# Patient Record
Sex: Male | Born: 1950 | Race: White | Hispanic: No | Marital: Married | Smoking: Never smoker
Health system: Southern US, Community
[De-identification: ages and names within clinical notes are randomized; demographics above are authoritative.]

## PROBLEM LIST (undated history)

## (undated) DIAGNOSIS — M1990 Unspecified osteoarthritis, unspecified site: Secondary | ICD-10-CM

## (undated) HISTORY — PX: NOSE SURGERY: SHX723

---

## 2009-06-16 HISTORY — PX: HERNIA REPAIR: SHX51

## 2012-04-15 ENCOUNTER — Other Ambulatory Visit (HOSPITAL_COMMUNITY): Payer: Self-pay | Admitting: Orthopaedic Surgery

## 2012-06-14 ENCOUNTER — Encounter (HOSPITAL_COMMUNITY): Payer: Self-pay | Admitting: Pharmacy Technician

## 2012-06-17 ENCOUNTER — Encounter (HOSPITAL_COMMUNITY)
Admission: RE | Admit: 2012-06-17 | Discharge: 2012-06-17 | Disposition: A | Discharge status: Home / Self Care | Payer: BC Managed Care – PPO | Source: Ambulatory Visit | Attending: Orthopaedic Surgery | Admitting: Orthopaedic Surgery

## 2012-06-17 ENCOUNTER — Encounter (HOSPITAL_COMMUNITY): Payer: Self-pay

## 2012-06-17 HISTORY — DX: Unspecified osteoarthritis, unspecified site: M19.90

## 2012-06-17 LAB — URINALYSIS, ROUTINE W REFLEX MICROSCOPIC
Bilirubin Urine: NEGATIVE
Glucose, UA: NEGATIVE mg/dL
Hgb urine dipstick: NEGATIVE
Ketones, ur: NEGATIVE mg/dL
Leukocytes, UA: NEGATIVE
Protein, ur: NEGATIVE mg/dL
pH: 6 (ref 5.0–8.0)

## 2012-06-17 LAB — BASIC METABOLIC PANEL
BUN: 19 mg/dL (ref 6–23)
CO2: 30 mEq/L (ref 19–32)
GFR calc non Af Amer: 88 mL/min — ABNORMAL LOW (ref >90)
Glucose, Bld: 87 mg/dL (ref 70–99)
Potassium: 4.4 mEq/L (ref 3.5–5.1)

## 2012-06-17 LAB — CBC
Hemoglobin: 14.4 g/dL (ref 13.0–17.0)
MCH: 28 pg (ref 26.0–34.0)
MCHC: 34.6 g/dL (ref 30.0–36.0)
MCV: 80.8 fL (ref 78.0–100.0)
RBC: 5.15 MIL/uL (ref 4.22–5.81)

## 2012-06-17 NOTE — Patient Instructions (Signed)
[name]  06/17/2012   Your procedure is scheduled on:  Friday 06/25/2012   Report to [facility] at 0530 AM.  Call this number if you have problems the morning of surgery: [phone]   Remember:   Do not eat food:After Midnight.  May have clear liquids:until Midnight .  Clear liquids include soda, tea, black coffee, apple or grape juice, broth.  Take these medicines the morning of surgery with A SIP OF WATER: none   Do not wear jewelry, make-up or nail polish.  Do not wear lotions, powders, or perfumes. You may wear deodorant.  Do not shave 48 hours prior to surgery. Men may shave face and neck.  Do not bring valuables to the hospital.  Contacts, dentures or bridgework may not be worn into surgery.  Leave suitcase in the car. After surgery it may be brought to your room.  For patients admitted to the hospital, checkout time is 11:00 AM the day of discharge.     Name and phone number of your drive: [email or web address]  Special Instructions: Read [facility] PREPARING FOR SURGERY-THIS IS ABOUT YOUR [name] BEFORE SURGERY!   Please read over the following fact sheets that you were given: MRSA Information, Blood Transfusion Sheet, Incentive Spirometry Sheet                If you do not follow these instructions as instructed, your surgery may be cancelled!                Patient signature:

## 2012-06-25 ENCOUNTER — Encounter (HOSPITAL_COMMUNITY)
Admission: RE | Disposition: A | Discharge status: Home / Self Care | Payer: Self-pay | Source: Ambulatory Visit | Attending: Orthopaedic Surgery

## 2012-06-25 ENCOUNTER — Encounter (HOSPITAL_COMMUNITY): Payer: Self-pay | Admitting: Anesthesiology

## 2012-06-25 ENCOUNTER — Inpatient Hospital Stay (HOSPITAL_COMMUNITY)
Admission: RE | Admit: 2012-06-25 | Discharge: 2012-06-26 | DRG: 818 | Disposition: A | Discharge status: Home / Self Care | Payer: BC Managed Care – PPO | Source: Ambulatory Visit | Attending: Orthopaedic Surgery | Admitting: Orthopaedic Surgery

## 2012-06-25 ENCOUNTER — Ambulatory Visit (HOSPITAL_COMMUNITY): Payer: BC Managed Care – PPO

## 2012-06-25 ENCOUNTER — Encounter (HOSPITAL_COMMUNITY): Payer: Self-pay | Admitting: *Deleted

## 2012-06-25 ENCOUNTER — Ambulatory Visit (HOSPITAL_COMMUNITY): Payer: BC Managed Care – PPO | Admitting: Anesthesiology

## 2012-06-25 DIAGNOSIS — M1610 Unilateral primary osteoarthritis, unspecified hip: Principal | ICD-10-CM | POA: Diagnosis present

## 2012-06-25 DIAGNOSIS — M169 Osteoarthritis of hip, unspecified: Secondary | ICD-10-CM

## 2012-06-25 HISTORY — PX: TOTAL HIP ARTHROPLASTY: SHX124

## 2012-06-25 LAB — ABO/RH: ABO/RH(D): A POS

## 2012-06-25 LAB — TYPE AND SCREEN

## 2012-06-25 SURGERY — ARTHROPLASTY, HIP, TOTAL, ANTERIOR APPROACH
Anesthesia: General | Site: Hip | Laterality: Right | Wound class: Clean

## 2012-06-25 MED ORDER — CEFAZOLIN SODIUM-DEXTROSE 2-3 GM-% IV SOLR
2.0000 g | INTRAVENOUS | Status: AC
  Administered 2012-06-25: 2 g via INTRAVENOUS

## 2012-06-25 MED ORDER — LACTATED RINGERS IV SOLN
INTRAVENOUS | Status: DC | PRN
  Administered 2012-06-25 (×4): via INTRAVENOUS

## 2012-06-25 MED ORDER — HYDROMORPHONE HCL PF 1 MG/ML IJ SOLN
0.2500 mg | INTRAMUSCULAR | Status: DC | PRN
  Administered 2012-06-25 (×4): 0.5 mg via INTRAVENOUS

## 2012-06-25 MED ORDER — ONDANSETRON HCL 4 MG/2ML IJ SOLN
INTRAMUSCULAR | Status: DC | PRN
  Administered 2012-06-25: 4 mg via INTRAVENOUS

## 2012-06-25 MED ORDER — HYDROMORPHONE HCL PF 1 MG/ML IJ SOLN
1.0000 mg | INTRAMUSCULAR | Status: DC | PRN
  Administered 2012-06-25: 1 mg via INTRAVENOUS

## 2012-06-25 MED ORDER — ALUM & MAG HYDROXIDE-SIMETH 200-200-20 MG/5ML PO SUSP: 30.0000 mL | ORAL | Status: DC | PRN

## 2012-06-25 MED ORDER — PROMETHAZINE HCL 25 MG/ML IJ SOLN: 6.2500 mg | INTRAMUSCULAR | Status: DC | PRN

## 2012-06-25 MED ORDER — LACTATED RINGERS IV SOLN
INTRAVENOUS | Status: DC
  Administered 2012-06-25: 10:00:00 via INTRAVENOUS

## 2012-06-25 MED ORDER — OXYCODONE HCL ER 20 MG PO T12A
20.0000 mg | EXTENDED_RELEASE_TABLET | Freq: Two times a day (BID) | ORAL | Status: DC
  Administered 2012-06-25: 20 mg via ORAL
  Filled 2012-06-25: qty 1

## 2012-06-25 MED ORDER — DIPHENHYDRAMINE HCL 12.5 MG/5ML PO ELIX
12.5000 mg | ORAL_SOLUTION | ORAL | Status: DC | PRN
  Administered 2012-06-25: 12.5 mg via ORAL
  Filled 2012-06-25: qty 5

## 2012-06-25 MED ORDER — HYDROMORPHONE HCL PF 1 MG/ML IJ SOLN
INTRAMUSCULAR | Status: AC
  Filled 2012-06-25: qty 1

## 2012-06-25 MED ORDER — EPHEDRINE SULFATE 50 MG/ML IJ SOLN
INTRAMUSCULAR | Status: DC | PRN
  Administered 2012-06-25: 5 mg via INTRAVENOUS

## 2012-06-25 MED ORDER — ZOLPIDEM TARTRATE 5 MG PO TABS: 5.0000 mg | ORAL_TABLET | Freq: Every evening | ORAL | Status: DC | PRN

## 2012-06-25 MED ORDER — ASPIRIN EC 325 MG PO TBEC
325.0000 mg | DELAYED_RELEASE_TABLET | Freq: Two times a day (BID) | ORAL | Status: DC
  Administered 2012-06-26: 325 mg via ORAL
  Filled 2012-06-25 (×3): qty 1

## 2012-06-25 MED ORDER — OXYCODONE-ACETAMINOPHEN 5-325 MG PO TABS: 1.0000 | ORAL_TABLET | ORAL | Status: AC | PRN

## 2012-06-25 MED ORDER — DOCUSATE SODIUM 100 MG PO CAPS
100.0000 mg | ORAL_CAPSULE | Freq: Two times a day (BID) | ORAL | Status: DC
  Administered 2012-06-25 – 2012-06-26 (×2): 100 mg via ORAL

## 2012-06-25 MED ORDER — METHOCARBAMOL 100 MG/ML IJ SOLN
500.0000 mg | Freq: Four times a day (QID) | INTRAVENOUS | Status: DC | PRN
  Administered 2012-06-25: 500 mg via INTRAVENOUS
  Filled 2012-06-25: qty 5

## 2012-06-25 MED ORDER — FERROUS SULFATE 325 (65 FE) MG PO TABS
325.0000 mg | ORAL_TABLET | Freq: Three times a day (TID) | ORAL | Status: DC
  Administered 2012-06-25 – 2012-06-26 (×2): 325 mg via ORAL
  Filled 2012-06-25 (×5): qty 1

## 2012-06-25 MED ORDER — OXYCODONE HCL 5 MG PO TABS: 5.0000 mg | ORAL_TABLET | ORAL | Status: DC | PRN

## 2012-06-25 MED ORDER — KETOROLAC TROMETHAMINE 15 MG/ML IJ SOLN
15.0000 mg | Freq: Four times a day (QID) | INTRAMUSCULAR | Status: AC
  Administered 2012-06-25 – 2012-06-26 (×4): 15 mg via INTRAVENOUS
  Filled 2012-06-25 (×4): qty 1

## 2012-06-25 MED ORDER — CEFAZOLIN SODIUM 1-5 GM-% IV SOLN
1.0000 g | Freq: Four times a day (QID) | INTRAVENOUS | Status: AC
  Administered 2012-06-25 (×2): 1 g via INTRAVENOUS
  Filled 2012-06-25 (×2): qty 50

## 2012-06-25 MED ORDER — SODIUM CHLORIDE 0.9 % IV SOLN
INTRAVENOUS | Status: DC
  Administered 2012-06-25: 13:00:00 via INTRAVENOUS

## 2012-06-25 MED ORDER — METHOCARBAMOL 500 MG PO TABS: 500.0000 mg | ORAL_TABLET | Freq: Four times a day (QID) | ORAL | Status: DC | PRN

## 2012-06-25 MED ORDER — 0.9 % SODIUM CHLORIDE (POUR BTL) OPTIME
TOPICAL | Status: DC | PRN
  Administered 2012-06-25: 1000 mL

## 2012-06-25 MED ORDER — ONDANSETRON HCL 4 MG/2ML IJ SOLN: 4.0000 mg | Freq: Four times a day (QID) | INTRAMUSCULAR | Status: DC | PRN

## 2012-06-25 MED ORDER — LACTATED RINGERS IV SOLN: INTRAVENOUS | Status: DC

## 2012-06-25 MED ORDER — ACETAMINOPHEN 10 MG/ML IV SOLN
INTRAVENOUS | Status: DC | PRN
  Administered 2012-06-25: 1000 mg via INTRAVENOUS

## 2012-06-25 MED ORDER — ASPIRIN 325 MG PO TBEC: 325.0000 mg | DELAYED_RELEASE_TABLET | Freq: Two times a day (BID) | ORAL | Status: DC

## 2012-06-25 MED ORDER — METHOCARBAMOL 500 MG PO TABS
500.0000 mg | ORAL_TABLET | Freq: Four times a day (QID) | ORAL | Status: DC | PRN
  Administered 2012-06-25: 500 mg via ORAL
  Filled 2012-06-25: qty 1

## 2012-06-25 MED ORDER — PROPOFOL 10 MG/ML IV BOLUS
INTRAVENOUS | Status: DC | PRN
  Administered 2012-06-25: 170 mg via INTRAVENOUS

## 2012-06-25 MED ORDER — ONDANSETRON HCL 4 MG PO TABS: 4.0000 mg | ORAL_TABLET | Freq: Four times a day (QID) | ORAL | Status: DC | PRN

## 2012-06-25 MED ORDER — FERROUS SULFATE 325 (65 FE) MG PO TABS: 325.0000 mg | ORAL_TABLET | Freq: Three times a day (TID) | ORAL | Status: DC

## 2012-06-25 MED ORDER — ADULT MULTIVITAMIN W/MINERALS CH
1.0000 | ORAL_TABLET | Freq: Every day | ORAL | Status: DC
  Administered 2012-06-25 – 2012-06-26 (×2): 1 via ORAL
  Filled 2012-06-25 (×2): qty 1

## 2012-06-25 MED ORDER — CISATRACURIUM BESYLATE (PF) 10 MG/5ML IV SOLN
INTRAVENOUS | Status: DC | PRN
  Administered 2012-06-25: 10 mg via INTRAVENOUS
  Administered 2012-06-25: 3 mg via INTRAVENOUS

## 2012-06-25 MED ORDER — ACETAMINOPHEN 325 MG PO TABS
650.0000 mg | ORAL_TABLET | Freq: Four times a day (QID) | ORAL | Status: DC | PRN
  Administered 2012-06-25: 650 mg via ORAL
  Filled 2012-06-25: qty 2

## 2012-06-25 MED ORDER — MIDAZOLAM HCL 5 MG/5ML IJ SOLN
INTRAMUSCULAR | Status: DC | PRN
  Administered 2012-06-25: 2 mg via INTRAVENOUS

## 2012-06-25 MED ORDER — SODIUM CHLORIDE 0.9 % IR SOLN
Status: DC | PRN
  Administered 2012-06-25: 1000 mL

## 2012-06-25 MED ORDER — MENTHOL 3 MG MT LOZG: 1.0000 | LOZENGE | OROMUCOSAL | Status: DC | PRN

## 2012-06-25 MED ORDER — CEFAZOLIN SODIUM-DEXTROSE 2-3 GM-% IV SOLR
INTRAVENOUS | Status: AC
  Filled 2012-06-25: qty 50

## 2012-06-25 MED ORDER — METOCLOPRAMIDE HCL 10 MG PO TABS: 5.0000 mg | ORAL_TABLET | Freq: Three times a day (TID) | ORAL | Status: DC | PRN

## 2012-06-25 MED ORDER — ACETAMINOPHEN 650 MG RE SUPP: 650.0000 mg | Freq: Four times a day (QID) | RECTAL | Status: DC | PRN

## 2012-06-25 MED ORDER — FENTANYL CITRATE 0.05 MG/ML IJ SOLN
INTRAMUSCULAR | Status: DC | PRN
  Administered 2012-06-25: 50 ug via INTRAVENOUS
  Administered 2012-06-25 (×2): 100 ug via INTRAVENOUS

## 2012-06-25 MED ORDER — METOCLOPRAMIDE HCL 5 MG/ML IJ SOLN: 5.0000 mg | Freq: Three times a day (TID) | INTRAMUSCULAR | Status: DC | PRN

## 2012-06-25 MED ORDER — PHENOL 1.4 % MT LIQD: 1.0000 | OROMUCOSAL | Status: DC | PRN

## 2012-06-25 MED ORDER — ACETAMINOPHEN 10 MG/ML IV SOLN
INTRAVENOUS | Status: AC
  Filled 2012-06-25: qty 100

## 2012-06-25 SURGICAL SUPPLY — 38 items
BAG ZIPLOCK 12X15 (MISCELLANEOUS) ×4 IMPLANT
BLADE SAW SGTL 18X1.27X75 (BLADE) ×2 IMPLANT
CLOTH BEACON ORANGE TIMEOUT ST (SAFETY) ×2 IMPLANT
DERMABOND ADVANCED (GAUZE/BANDAGES/DRESSINGS) ×2
DERMABOND ADVANCED .7 DNX12 (GAUZE/BANDAGES/DRESSINGS) ×2 IMPLANT
DRAPE C-ARM 42X72 X-RAY (DRAPES) ×2 IMPLANT
DRAPE STERI IOBAN 125X83 (DRAPES) ×2 IMPLANT
DRAPE U-SHAPE 47X51 STRL (DRAPES) ×6 IMPLANT
DRSG AQUACEL AG ADV 3.5X10 (GAUZE/BANDAGES/DRESSINGS) ×2 IMPLANT
DRSG MEPILEX BORDER 4X8 (GAUZE/BANDAGES/DRESSINGS) ×2 IMPLANT
DURAPREP 26ML APPLICATOR (WOUND CARE) ×2 IMPLANT
ELECT BLADE TIP CTD 4 INCH (ELECTRODE) ×2 IMPLANT
ELECT REM PT RETURN 9FT ADLT (ELECTROSURGICAL) ×2
ELECTRODE REM PT RTRN 9FT ADLT (ELECTROSURGICAL) ×1 IMPLANT
FACESHIELD LNG OPTICON STERILE (SAFETY) ×8 IMPLANT
GAUZE XEROFORM 1X8 LF (GAUZE/BANDAGES/DRESSINGS) ×2 IMPLANT
GLOVE BIO SURGEON STRL SZ7 (GLOVE) ×2 IMPLANT
GLOVE BIO SURGEON STRL SZ7.5 (GLOVE) ×2 IMPLANT
GLOVE BIOGEL PI IND STRL 7.5 (GLOVE) IMPLANT
GLOVE BIOGEL PI IND STRL 8 (GLOVE) ×1 IMPLANT
GLOVE BIOGEL PI INDICATOR 7.5 (GLOVE)
GLOVE BIOGEL PI INDICATOR 8 (GLOVE) ×1
GLOVE ECLIPSE 7.0 STRL STRAW (GLOVE) ×2 IMPLANT
GOWN STRL REIN XL XLG (GOWN DISPOSABLE) ×4 IMPLANT
HANDPIECE INTERPULSE COAX TIP (DISPOSABLE) ×1
KIT BASIN OR (CUSTOM PROCEDURE TRAY) ×2 IMPLANT
PACK TOTAL JOINT (CUSTOM PROCEDURE TRAY) ×2 IMPLANT
PADDING CAST COTTON 6X4 STRL (CAST SUPPLIES) ×2 IMPLANT
SET HNDPC FAN SPRY TIP SCT (DISPOSABLE) ×1 IMPLANT
STAPLER VISISTAT 35W (STAPLE) IMPLANT
SUT ETHIBOND NAB CT1 #1 30IN (SUTURE) ×4 IMPLANT
SUT VIC AB 1 CT1 36 (SUTURE) ×4 IMPLANT
SUT VIC AB 2-0 CT1 27 (SUTURE) ×2
SUT VIC AB 2-0 CT1 TAPERPNT 27 (SUTURE) ×2 IMPLANT
TOWEL OR 17X26 10 PK STRL BLUE (TOWEL DISPOSABLE) ×4 IMPLANT
TOWEL OR NON WOVEN STRL DISP B (DISPOSABLE) ×2 IMPLANT
TRAY FOLEY CATH 14FRSI W/METER (CATHETERS) ×2 IMPLANT
WATER STERILE IRR 1500ML POUR (IV SOLUTION) ×2 IMPLANT

## 2012-06-25 NOTE — Anesthesia Preprocedure Evaluation (Addendum)
Anesthesia Evaluation  Patient identified by MRN, date of birth, ID band Patient awake    Reviewed: Allergy & Precautions, H&P , NPO status , Patient's Chart, lab work & pertinent test results  Airway Mallampati: I TM Distance: >3 FB Neck ROM: Full    Dental  (+) Teeth Intact and Dental Advisory Given   Pulmonary neg pulmonary ROS,  breath sounds clear to auscultation  Pulmonary exam normal       Cardiovascular negative cardio ROS  Rhythm:Regular Rate:Normal     Neuro/Psych negative neurological ROS  negative psych ROS   GI/Hepatic negative GI ROS, Neg liver ROS,   Endo/Other  negative endocrine ROS  Renal/GU negative Renal ROS  negative genitourinary   Musculoskeletal negative musculoskeletal ROS (+)   Abdominal   Peds  Hematology negative hematology ROS (+)   Anesthesia Other Findings   Reproductive/Obstetrics negative OB ROS                           Anesthesia Physical Anesthesia Plan  ASA: I  Anesthesia Plan: General   Post-op Pain Management:    Induction: Intravenous  Airway Management Planned: Oral ETT  Additional Equipment:   Intra-op Plan:   Post-operative Plan: Extubation in OR  Informed Consent: I have reviewed the patients History and Physical, chart, labs and discussed the procedure including the risks, benefits and alternatives for the proposed anesthesia with the patient or authorized representative who has indicated his/her understanding and acceptance.   Dental advisory given  Plan Discussed with: CRNA  Anesthesia Plan Comments:         Anesthesia Quick Evaluation

## 2012-06-25 NOTE — Transfer of Care (Signed)
Immediate Anesthesia Transfer of Care Note  Patient: Earl Stanley  Procedure(s) Performed: Procedure(s) (LRB) with comments: TOTAL HIP ARTHROPLASTY ANTERIOR APPROACH (Right) - Right Total Hip Arthroplasty, Anterior Approach (C-Arm)  Patient Location: PACU  Anesthesia Type:General  Level of Consciousness: awake, sedated and patient cooperative  Airway & Oxygen Therapy: Patient Spontanous Breathing and Patient connected to face mask oxygen  Post-op Assessment: Report given to PACU RN and Post -op Vital signs reviewed and stable  Post vital signs: Reviewed and stable  Complications: No apparent anesthesia complications

## 2012-06-25 NOTE — Anesthesia Postprocedure Evaluation (Signed)
Anesthesia Post Note  Patient: Earl Stanley  Procedure(s) Performed: Procedure(s) (LRB): TOTAL HIP ARTHROPLASTY ANTERIOR APPROACH (Right)  Anesthesia type: General  Patient location: PACU  Post pain: Pain level controlled  Post assessment: Post-op Vital signs reviewed  Last Vitals:  Filed Vitals:   06/25/12 1431  BP: 116/59  Pulse: 71  Temp: 37 C  Resp: 16    Post vital signs: Reviewed  Level of consciousness: sedated  Complications: No apparent anesthesia complications

## 2012-06-25 NOTE — Plan of Care (Signed)
Problem: Consults Goal: Diagnosis- Total Joint Replacement Right anterior hip     

## 2012-06-25 NOTE — H&P (Signed)
TOTAL HIP ADMISSION H&P  Patient is admitted for right total hip arthroplasty.  Subjective:  Chief Complaint: right hip pain  HPI: Earl Stanley, 62 y.o. male, has a history of pain and functional disability in the right hip(s) due to arthritis and patient has failed non-surgical conservative treatments for greater than 12 weeks to include NSAID's and/or analgesics, flexibility and strengthening excercises, weight reduction as appropriate and activity modification.  Onset of symptoms was gradual starting 6 years ago with gradually worsening course since that time.The patient noted no past surgery on the right hip(s).  Patient currently rates pain in the right hip at 10 out of 10 with activity. Patient has night pain, worsening of pain with activity and weight bearing, pain that interfers with activities of daily living, pain with passive range of motion and crepitus. Patient has evidence of subchondral cysts, subchondral sclerosis, periarticular osteophytes and joint space narrowing by imaging studies. This condition presents safety issues increasing the risk of falls.  There is no current active infection.  Patient Active Problem List   Diagnosis Date Noted  . Degenerative arthritis of hip 06/25/2012   Past Medical History  Diagnosis Date  . Arthritis     Past Surgical History  Procedure Date  . Hernia repair 2011    left inguinal hernia repair  . Nose surgery     chain saw accident on nose    Prescriptions prior to admission  Medication Sig Dispense Refill  . Multiple Vitamin (MULTIVITAMIN WITH MINERALS) TABS Take 1 tablet by mouth daily.       No Known Allergies  History  Substance Use Topics  . Smoking status: Not on file  . Smokeless tobacco: Not on file  . Alcohol Use: No    History reviewed. No pertinent family history.   Review of Systems  Musculoskeletal: Positive for joint pain.  All other systems reviewed and are negative.    Objective:  Physical Exam    Constitutional: He is oriented to person, place, and time. He appears well-developed and well-nourished.  HENT:  Head: Normocephalic and atraumatic.  Eyes: EOM are normal. Pupils are equal, round, and reactive to light.  Neck: Normal range of motion. Neck supple.  Cardiovascular: Normal rate and regular rhythm.   Respiratory: Effort normal and breath sounds normal.  GI: Soft. Bowel sounds are normal.  Musculoskeletal:       Right hip: He exhibits decreased range of motion, decreased strength, bony tenderness and crepitus.  Neurological: He is alert and oriented to person, place, and time.  Skin: Skin is warm and dry.  Psychiatric: He has a normal mood and affect.    Vital signs in last 24 hours: Temp:  [97.5 F (36.4 C)] 97.5 F (36.4 C) (01/10 0521) Pulse Rate:  [58] 58  (01/10 0521) Resp:  [18] 18  (01/10 0521) BP: (131)/(61) 131/61 mmHg (01/10 0521) SpO2:  [100 %] 100 % (01/10 0521)  Labs:   There is no height or weight on file to calculate BMI.   Imaging Review Plain radiographs demonstrate severe degenerative joint disease of the bilateral hip(s). The bone quality appears to be good for age and reported activity level.  Assessment/Plan:  End stage arthritis, right hip(s)  The patient history, physical examination, clinical judgement of the provider and imaging studies are consistent with end stage degenerative joint disease of the right hip(s) and total hip arthroplasty is deemed medically necessary. The treatment options including medical management, injection therapy, arthroscopy and arthroplasty were discussed  at length. The risks and benefits of total hip arthroplasty were presented and reviewed. The risks due to aseptic loosening, infection, stiffness, dislocation/subluxation,  thromboembolic complications and other imponderables were discussed.  The patient acknowledged the explanation, agreed to proceed with the plan and consent was signed. Patient is being admitted  for inpatient treatment for surgery, pain control, PT, OT, prophylactic antibiotics, VTE prophylaxis, progressive ambulation and ADL's and discharge planning.The patient is planning to be discharged home with home health services

## 2012-06-25 NOTE — Evaluation (Signed)
Physical Therapy Evaluation Patient Details Name: Earl Stanley MRN: [id number] DOB: 05-13-1951 Today's Date: 06/25/2012 Time: 4098-1191 PT Time Calculation (min): 35 min  PT Assessment / Plan / Recommendation Clinical Impression  Pt. is s/p RTHA-direct anterior today 1/10. Pt ambulated 400 ft w/ RW. Pt. may be able to use crutches vs. RW. Pt will benefit from PT while in acute. Possible DC tomorrow.    PT Assessment  Patient needs continued PT services    Follow Up Recommendations  Home health PT    Does the patient have the potential to tolerate intense rehabilitation      Barriers to Discharge        Equipment Recommendations  Rolling walker with 5" wheels (possible will need tall Rw.)    Recommendations for Other Services     Frequency 7X/week    Precautions / Restrictions Precautions Precautions: None   Pertinent Vitals/Pain Sore in ant. Hip on R      Mobility  Bed Mobility Bed Mobility: Supine to Sit Supine to Sit: 4: Min assist;With rails;HOB elevated Details for Bed Mobility Assistance: cues to turn at angle. Transfers Transfers: Sit to Stand;Stand to Sit Sit to Stand: 4: Min assist;From bed Stand to Sit: To chair/3-in-1;With armrests;5: Supervision Ambulation/Gait Ambulation/Gait Assistance: 4: Min guard Ambulation Distance (Feet): 400 Feet Assistive device: Rolling walker Ambulation/Gait Assistance Details: cues to not let go RW while walking, sequence. full WB allowed. Gait Pattern: Step-through pattern;Decreased stance time - right    Shoulder Instructions     Exercises     PT Diagnosis: Difficulty walking  PT Problem List: Decreased strength;Decreased range of motion;Decreased activity tolerance;Decreased mobility;Decreased safety awareness PT Treatment Interventions: DME instruction;Gait training;Stair training;Functional mobility training;Therapeutic activities;Therapeutic exercise;Patient/family education   PT Goals Acute Rehab PT  Goals PT Goal Formulation: With patient/family Time For Goal Achievement: 07/02/12 Potential to Achieve Goals: Good Pt will go Supine/Side to Sit: Independently PT Goal: Supine/Side to Sit - Progress: Goal set today Pt will go Sit to Supine/Side: Independently PT Goal: Sit to Supine/Side - Progress: Goal set today Pt will go Sit to Stand: with supervision PT Goal: Sit to Stand - Progress: Goal set today Pt will go Stand to Sit: with modified independence PT Goal: Stand to Sit - Progress: Goal set today Pt will Ambulate: >150 feet;with supervision;with least restrictive assistive device PT Goal: Ambulate - Progress: Goal set today Pt will Go Up / Down Stairs: 1-2 stairs;with supervision;with least restrictive assistive device PT Goal: Up/Down Stairs - Progress: Goal set today Pt will Perform Home Exercise Program: Independently PT Goal: Perform Home Exercise Program - Progress: Goal set today  Visit Information  Last PT Received On: 06/25/12 Assistance Needed: +1    Subjective Data  Subjective: It feels so good to get up. I can go to work [employer]. Patient Stated Goal: to go home soon   Prior Functioning  Home Living Lives With: Spouse Available Help at Discharge: Family Type of Home: House Home Access: Stairs to enter Home Layout: One level Bathroom Toilet: Handicapped height Home Adaptive Equipment: Environmental consultant - rolling;Crutches (can borrow a RW, ?if tall enough.) Prior Function Level of Independence: Independent Able to Take Stairs?: Yes Driving: Yes Vocation: Full time employment Communication Communication: No difficulties    Cognition  Overall Cognitive Status: Appears within functional limits for tasks assessed/performed Arousal/Alertness: Awake/alert Orientation Level: Appears intact for tasks assessed Behavior During Session: [facility] for tasks performed    Extremity/Trunk Assessment Right Lower Extremity Assessment RLE ROM/Strength/Tone: Deficits RLE  ROM/Strength/Tone Deficits: decreased hip flexion, able to advance RLE  during gait.  RLE Sensation: WFL - Light Touch Left Lower Extremity Assessment LLE ROM/Strength/Tone: WFL for tasks assessed Trunk Assessment Trunk Assessment: Normal   Balance    End of Session PT - End of Session Activity Tolerance: Patient tolerated treatment well Patient left: in chair;with call bell/phone within reach Nurse Communication: Mobility status  GP     Rada Hay 06/25/2012, 4:52 PM

## 2012-06-25 NOTE — Brief Op Note (Signed)
06/25/2012  9:51 AM  PATIENT:  Earl Stanley  62 y.o. male  PRE-OPERATIVE DIAGNOSIS:  Severe osteoarthritis right hip  POST-OPERATIVE DIAGNOSIS:  Severe osteoarthritis right hip  PROCEDURE:  Procedure(s) (LRB) with comments: TOTAL HIP ARTHROPLASTY ANTERIOR APPROACH (Right) - Right Total Hip Arthroplasty, Anterior Approach (C-Arm)  SURGEON:  Surgeon(s) and Role:    * Kathryne Hitch, MD - Primary  PHYSICIAN ASSISTANT:   ASSISTANTS: none   ANESTHESIA:   general  EBL:  Total I/O In: 3600 [I.V.:3600] Out: 1225 [Urine:475; Blood:750]  BLOOD ADMINISTERED:none  DRAINS: none   LOCAL MEDICATIONS USED:  NONE  SPECIMEN:  No Specimen  DISPOSITION OF SPECIMEN:  N/A  COUNTS:  YES  TOURNIQUET:  * No tourniquets in log *  DICTATION: .Other Dictation: Dictation Number [id number]  PLAN OF CARE: Admit to inpatient   PATIENT DISPOSITION:  PACU - hemodynamically stable.   Delay start of Pharmacological VTE agent (>24hrs) due to surgical blood loss or risk of bleeding: no

## 2012-06-25 NOTE — Op Note (Signed)
NAMEROSS, BENDER NO.:  [id number]  MEDICAL RECORD NO.:  [id number]  LOCATION:  1609                         FACILITY:  [facility]  PHYSICIAN:  Vanita Panda. Magnus Ivan, M.D.DATE OF BIRTH:  08/27/1950  DATE OF PROCEDURE:  06/25/2012 DATE OF DISCHARGE:                              OPERATIVE REPORT   PREOPERATIVE DIAGNOSES:  End-stage arthritis and degenerative joint disease, right hip.  POSTOPERATIVE DIAGNOSES:  End-stage arthritis and degenerative joint disease, right hip.  PROCEDURE:  Right total hip arthroplasty through direct anterior approach.  IMPLANTS:  DePuy Sector Gription acetabular component size 54, size 36+ 4 neutral polyethylene liner, size 13 Corail femoral component with standard offset (KA), size 36+ 1.5 ceramic hip ball.  SURGEON:  Vanita Panda. Magnus Ivan, M.D.  ANESTHESIA:  General.  BLOOD LOSS:  750 mL.  ANTIBIOTICS:  2 g IV Ancef.  COMPLICATIONS:  None.  INDICATIONS:  Mr. Lupercio is a 62 year old gentleman with well documented end-stage arthritis of his right hip.  His x-rays show complete loss of the joint space, subchondral sclerosis cyst and peripheral osteophytes. He has failed conservative treatment and wished to proceed with a total hip arthroplasty given the daily pain and the effect of activities of daily living.  He understands the risks of acute blood loss, anemia, DVT, PE, infection and nerve and vessel injury.  He does give informed consent for surgery.  PROCEDURE DESCRIPTION:  After informed consent was obtained, appropriate right hip was marked.  He was brought to the operating room and general anesthesia was obtained while he was on the stretcher.  Foley catheter was placed in both feet, had traction boots applied to them.  He was placed supine on the Hana OSI fracture table with the perineal post in place and both legs in inline skeletal traction but no traction applied. We assessed his right and left hips and the  center of the pelvis under direct fluoroscopy, so we could obtain our leg lengths.  We then prepped the right hip meticulously with DuraPrep and sterile drapes.  Time-out was called and he was identified the correct patient, correct right hip. I then made an incision inferior and posterior to the anterior superior iliac spine and dissected down to the tensor fascia lata.  The tensor fascia was then divided longitudinally and proceeded with a direct anterior approach to the hip.  A Cobra retractor was placed around the lateral neck and up underneath the reflected head of the rectus femoris, a Cobra retractor was placed medially.  I cauterized the lateral femoral circumflex vessels and then opened up the hip capsule in a L type format from superior down to lateral and then medial.  I placed the Hohmann retractors within the hip capsule and then made my femoral neck cut just proximal to the lesser trochanter with an oscillating saw and completed this with an osteotome.  I used a corkscrew guide and removed the femoral head in its entirety.  It was devoid of cartilage.  I then placed a bent Hohmann medially and a Cobra retractor laterally.  I cleaned the acetabular remnants of the labrum and fovea and then I began reaming from size 42 reamer in 2 mm  increments up to a size 54 with all reamers placed under direct visualization and the last reamer placed under direct fluoroscopy as well as visualization, so I could obtain my depth of reaming, my inclination and anteversion.  I then placed the real size 54 Sector Gription acetabular component, the apex hole eliminator guide.  I placed a 36+ 4 neutral polyethylene liner. Attention was then turned to the femur.  With the leg externally rotated to 90 degrees, extended and adducted, I placed a Mueller retractor medially and Hohmann retractor behind the greater trochanter.  I released the lateral capsule and then used a box cutting guide to open up  the femoral canal.  I used a rongeur to lateralize and then began broaching from a size 8 broach up to a size 13.  Size 13 broach filled the canal and was felt to be stable.  I trialed a standard neck in a 36+ 1.5 hip ball.  We brought the leg back up and over and with traction internal rotation reduced the hip.  It was stable to 90 degrees of external rotation and 45 degrees of internal rotation.  He had minimal shuck and his leg lengths were measured equal under direct fluoroscopy. I then dislocated the hip and removed the trial instrumentation and components.  I placed the real Corail acetabular component with HA coating and a collar and standard offset.  I placed the real 36+ 1.5 ceramic hip ball, reduced this back in the acetabulum and was stable.  I then used 1000 mL of normal saline solution to the pulsatile lavage.  I closed the joint capsule with interrupted #1 Ethibond suture followed by a running 0 V-Loc in the tensor fascia, 2-0 Vicryl in the deep and subcutaneous tissue and a 4-0 Monocryl subcuticular stitch and Dermabond was placed and then Aquacel dressing.  He was taken off the Hana table, awakened, extubated, and taken to recovery room in stable condition. All final counts were correct.  There were no complications noted.     Vanita Panda. Magnus Ivan, M.D.     CYB/MEDQ  D:  06/25/2012  T:  06/25/2012  Job:  [id number]

## 2012-06-26 LAB — CBC
HCT: 28.4 % — ABNORMAL LOW (ref 39.0–52.0)
MCH: 28.1 pg (ref 26.0–34.0)
MCHC: 33.8 g/dL (ref 30.0–36.0)
RDW: 13.6 % (ref 11.5–15.5)

## 2012-06-26 LAB — BASIC METABOLIC PANEL
BUN: 10 mg/dL (ref 6–23)
Calcium: 8.3 mg/dL — ABNORMAL LOW (ref 8.4–10.5)
GFR calc Af Amer: >90 mL/min (ref >90)
GFR calc non Af Amer: 80 mL/min — ABNORMAL LOW (ref >90)
Glucose, Bld: 105 mg/dL — ABNORMAL HIGH (ref 70–99)
Potassium: 4.5 mEq/L (ref 3.5–5.1)
Sodium: 141 mEq/L (ref 135–145)

## 2012-06-26 NOTE — Care Management Note (Signed)
    Page 1 of 1   06/26/2012     4:18:45 PM   CARE MANAGEMENT NOTE 06/26/2012  Patient:  Earl Stanley, Earl Stanley   Account Number:  [id number]  Date Initiated:  06/26/2012  Documentation initiated by:  Lanier Clam  Subjective/Objective Assessment:   ADMITTED W/DEGENERATIVE ARTHRITIS OF R HIP.     Action/Plan:   FROM HOME   Anticipated DC Date:  06/26/2012   Anticipated DC Plan:  HOME/SELF CARE      DC Planning Services  CM consult      Choice offered to / List presented to:             Status of service:  Completed, signed off Medicare Important Message given?   (If response is "NO", the following Medicare IM given date fields will be blank) Date Medicare IM given:   Date Additional Medicare IM given:    Discharge Disposition:  HOME/SELF CARE  Per UR Regulation:    If discussed at Long Length of Stay Meetings, dates discussed:    Comments:  06/26/12 Ian Castagna RN,BSN NCM [phone] PT-HH.OT-NO OT F/U.NO HH ORDERS.PATIENT D/C HOME.

## 2012-06-26 NOTE — Progress Notes (Signed)
Physical Therapy Treatment Patient Details Name: Earl Stanley MRN: [id number] DOB: [birth year] Today's Date: 06/26/2012 Time: 4098-1191 PT Time Calculation (min): 31 min  PT Assessment / Plan / Recommendation Comments on Treatment Session  Pt progressing very well with all mobility and is ready for D/C.     Follow Up Recommendations  Home health PT     Does the patient have the potential to tolerate intense rehabilitation     Barriers to Discharge        Equipment Recommendations  Rolling walker with 5" wheels    Recommendations for Other Services    Frequency 7X/week   Plan Discharge plan remains appropriate    Precautions / Restrictions Precautions Precautions: None Restrictions Weight Bearing Restrictions: No Other Position/Activity Restrictions: WBAT   Pertinent Vitals/Pain 2/10 pain    Mobility  Bed Mobility Bed Mobility: Supine to Sit Supine to Sit: 5: Supervision Details for Bed Mobility Assistance: Supervision for safety with min cues for managing RLE Transfers Transfers: Sit to Stand;Stand to Sit Sit to Stand: 5: Supervision;With upper extremity assist;From bed;From toilet Stand to Sit: 5: Supervision;With upper extremity assist;With armrests;To chair/3-in-1;To toilet Details for Transfer Assistance: Min cues for hand placement and safety.  Pt able to get on/off of toilet in room with cues for LE management and hand placement.  Ambulation/Gait Ambulation/Gait Assistance: 5: Supervision Ambulation Distance (Feet): 400 Feet Assistive device: Rolling walker Ambulation/Gait Assistance Details: Cues for using two crutches, however pt did very well so transitioned to single crutch and did very well with cues for correct sequencing.   Gait Pattern: Step-through pattern;Decreased stance time - right Stairs:  (Discussed negotiating stairs.)    Exercises Total Joint Exercises Hip ABduction/ADduction: AROM;Right;10 reps;Standing Knee Flexion: AROM;Right;10  reps;Standing Marching in Standing: AROM;Both;10 reps;Standing   PT Diagnosis:    PT Problem List:   PT Treatment Interventions:     PT Goals Acute Rehab PT Goals PT Goal Formulation: With patient/family Time For Goal Achievement: 07/02/12 Potential to Achieve Goals: Good Pt will go Supine/Side to Sit: Independently PT Goal: Supine/Side to Sit - Progress: Progressing toward goal Pt will go Sit to Stand: with supervision PT Goal: Sit to Stand - Progress: Met Pt will go Stand to Sit: with modified independence PT Goal: Stand to Sit - Progress: Progressing toward goal Pt will Ambulate: >150 feet;with supervision;with least restrictive assistive device PT Goal: Ambulate - Progress: Met Pt will Go Up / Down Stairs: 1-2 stairs;with supervision;with least restrictive assistive device PT Goal: Up/Down Stairs - Progress: Partly met Pt will Perform Home Exercise Program: Independently PT Goal: Perform Home Exercise Program - Progress: Progressing toward goal  Visit Information  Last PT Received On: 06/26/12 Assistance Needed: +1    Subjective Data  Subjective: I don't really have any pain, I'm just worried about putting too much weight on the leg.  Patient Stated Goal: to go home soon   Cognition  Overall Cognitive Status: Appears within functional limits for tasks assessed/performed Arousal/Alertness: Awake/alert Orientation Level: Appears intact for tasks assessed Behavior During Session: [facility] for tasks performed    Balance     End of Session PT - End of Session Activity Tolerance: Patient tolerated treatment well Patient left: in chair;with call bell/phone within reach Nurse Communication: Mobility status   GP     Vista Deck 06/26/2012, 10:29 AM

## 2012-06-26 NOTE — Discharge Summary (Signed)
Physician Discharge Summary  Patient ID: AVIEL DAVALOS MRN: [id number] DOB/AGE: Apr 27, 1951 62 y.o.  Admit date: 06/25/2012 Discharge date: 06/26/2012  Admission Diagnoses: Degenerative arthritis right hip  Discharge Diagnoses:  Principal Problem:  *Degenerative arthritis of hip   Discharged Condition: stable  Hospital Course: Patient's hospital course was essentially remarkable. He underwent anterior right total hip arthroplasty. Postoperatively he progressed well with therapy and was discharged to home in stable condition.  Consults: None  Significant Diagnostic Studies: labs: Routine labs  Treatments: surgery: See operative note  Discharge Exam: Blood pressure 95/50, pulse 66, temperature 98.2 F (36.8 C), temperature source Oral, resp. rate 16, height 5\' 11"  (1.803 m), weight 78.926 kg (174 lb), SpO2 97.00%. Incision/Wound: dressing clean dry and intact.  Disposition: Final discharge disposition not confirmed  Discharge Orders    Future Orders Please Complete By Expires   Diet - low sodium heart healthy      Call MD / Call 911      Comments:   If you experience chest pain or shortness of breath, CALL 911 and be transported to the hospital emergency room.  If you develope a fever above 101 F, pus (white drainage) or increased drainage or redness at the wound, or calf pain, call your surgeon's office.   Constipation Prevention      Comments:   Drink plenty of fluids.  Prune juice may be helpful.  You may use a stool softener, such as Colace (over the counter) 100 mg twice a day.  Use MiraLax (over the counter) for constipation as needed.   Increase activity slowly as tolerated          Medication List     As of 06/26/2012  9:24 AM    TAKE these medications         aspirin 325 MG EC tablet   Take 1 tablet (325 mg total) by mouth 2 (two) times daily.      ferrous sulfate 325 (65 FE) MG tablet   Take 1 tablet (325 mg total) by mouth 3 (three) times daily after  meals.      methocarbamol 500 MG tablet   Commonly known as: ROBAXIN   Take 1 tablet (500 mg total) by mouth every 6 (six) hours as needed.      multivitamin with minerals Tabs   Take 1 tablet by mouth daily.      oxyCODONE-acetaminophen 5-325 MG per tablet   Commonly known as: PERCOCET/ROXICET   Take 1-2 tablets by mouth every 4 (four) hours as needed for pain.           Follow-up Information    Follow up with Kathryne Hitch, MD. In 2 weeks.   Contact information:   [address] [place] [address] [phone]          Signed: Nadara Mustard 06/26/2012, 9:24 AM

## 2012-06-26 NOTE — Progress Notes (Signed)
Discharged to family auto via wheelchair. Assessment unchanged from am. 

## 2012-06-26 NOTE — Evaluation (Signed)
Occupational Therapy Evaluation Patient Details Name: Earl Stanley MRN: [id number] DOB: 10-Jul-1950 Today's Date: 06/26/2012 Time: 4098-1191 OT Time Calculation (min): 15 min  OT Assessment / Plan / Recommendation Clinical Impression  Pt. is s/p RTHA-direct anterior. Pt is at supervision level for ADLs and will have prn A upon return home. All education is completed.    OT Assessment  Patient does not need any further OT services    Follow Up Recommendations  No OT follow up    Barriers to Discharge      Equipment Recommendations  None recommended by OT    Recommendations for Other Services    Frequency       Precautions / Restrictions Precautions Precautions: None Restrictions Weight Bearing Restrictions: No Other Position/Activity Restrictions: WBAT   Pertinent Vitals/Pain Pt reported minimal hip pain which he did not rate. Repositioned for comfort.    ADL  Grooming: Supervision/safety Where Assessed - Grooming: Supported standing Upper Body Bathing: Supervision/safety Where Assessed - Upper Body Bathing: Supported standing Lower Body Bathing: Supervision/safety Where Assessed - Lower Body Bathing: Supported sit to stand Upper Body Dressing: Supervision/safety Where Assessed - Upper Body Dressing: Supported standing Lower Body Dressing: Supervision/safety Where Assessed - Lower Body Dressing: Unsupported sitting;Supported sit to stand Toilet Transfer: Supervision/safety Statistician Method: Sit to Barista: Comfort height toilet Toileting - Architect and Hygiene: Supervision/safety Where Assessed - Engineer, mining and Hygiene: Sit to stand from 3-in-1 or toilet Tub/Shower Transfer: Supervision/safety Tub/Shower Transfer Method: Ambulating Equipment Used: Other (comment) (crutches.) Transfers/Ambulation Related to ADLs: Pt utizizing crutches for ambulation ADL Comments: Educated how to don sock with sock aid  with good return demo.    OT Diagnosis:    OT Problem List:   OT Treatment Interventions:     OT Goals    Visit Information  Last OT Received On: 06/26/12 Assistance Needed: +1    Subjective Data  Subjective: I just can't seem to lift my leg to put my sock on. Patient Stated Goal: Not asked.   Prior Functioning     Home Living Lives With: Spouse Available Help at Discharge: Family Type of Home: House Home Access: Stairs to enter Secretary/administrator of Steps: 1 Home Layout: One level Bathroom Shower/Tub: Engineer, manufacturing systems: Handicapped height Home Adaptive Equipment: Environmental consultant - rolling;Crutches Prior Function Level of Independence: Independent Able to Take Stairs?: Yes Vocation: Full time employment Communication Communication: No difficulties         Vision/Perception     Cognition  Overall Cognitive Status: Appears within functional limits for tasks assessed/performed Arousal/Alertness: Awake/alert Orientation Level: Appears intact for tasks assessed Behavior During Session: [facility] for tasks performed    Extremity/Trunk Assessment Right Upper Extremity Assessment RUE ROM/Strength/Tone: [facility] for tasks assessed Left Upper Extremity Assessment LUE ROM/Strength/Tone: WFL for tasks assessed     Mobility Bed Mobility Bed Mobility: Supine to Sit Supine to Sit: 5: Supervision Details for Bed Mobility Assistance: Supervision for safety with min cues for managing RLE Transfers Sit to Stand: 5: Supervision;With upper extremity assist;From bed;From toilet Stand to Sit: 5: Supervision;With upper extremity assist;With armrests;To chair/3-in-1;To toilet Details for Transfer Assistance: Min cues for hand placement and safety.  Pt able to get on/off of toilet in room with cues for LE management and hand placement.      Shoulder Instructions     Exercise    Balance     End of Session OT - End of Session Activity Tolerance: Patient tolerated treatment  well Patient left: in bed;with call bell/phone within reach  GO     Earl Stanley A OTR/L [phone] 06/26/2012, 1:23 PM

## 2012-06-29 ENCOUNTER — Encounter (HOSPITAL_COMMUNITY): Payer: Self-pay | Admitting: Orthopaedic Surgery

## 2013-04-22 ENCOUNTER — Other Ambulatory Visit (HOSPITAL_COMMUNITY): Payer: Self-pay | Admitting: Orthopaedic Surgery

## 2013-05-18 ENCOUNTER — Encounter (HOSPITAL_COMMUNITY): Payer: Self-pay | Admitting: Pharmacy Technician

## 2013-05-20 ENCOUNTER — Encounter (HOSPITAL_COMMUNITY)
Admission: RE | Admit: 2013-05-20 | Discharge: 2013-05-20 | Disposition: A | Discharge status: Home / Self Care | Payer: BC Managed Care – PPO | Source: Ambulatory Visit | Attending: Orthopaedic Surgery | Admitting: Orthopaedic Surgery

## 2013-05-20 ENCOUNTER — Encounter (HOSPITAL_COMMUNITY): Payer: Self-pay

## 2013-05-20 DIAGNOSIS — Z01812 Encounter for preprocedural laboratory examination: Secondary | ICD-10-CM | POA: Insufficient documentation

## 2013-05-20 LAB — URINALYSIS, ROUTINE W REFLEX MICROSCOPIC
Bilirubin Urine: NEGATIVE
Glucose, UA: NEGATIVE mg/dL
Hgb urine dipstick: NEGATIVE
Ketones, ur: NEGATIVE mg/dL
Protein, ur: NEGATIVE mg/dL
Urobilinogen, UA: 0.2 mg/dL (ref 0.0–1.0)

## 2013-05-20 LAB — CBC
HCT: 41.7 % (ref 39.0–52.0)
Hemoglobin: 14.7 g/dL (ref 13.0–17.0)
MCH: 28.5 pg (ref 26.0–34.0)
MCHC: 35.3 g/dL (ref 30.0–36.0)
MCV: 81 fL (ref 78.0–100.0)
RDW: 13.2 % (ref 11.5–15.5)

## 2013-05-20 LAB — BASIC METABOLIC PANEL
BUN: 13 mg/dL (ref 6–23)
CO2: 29 mEq/L (ref 19–32)
Chloride: 103 mEq/L (ref 96–112)
Creatinine, Ser: 1.01 mg/dL (ref 0.50–1.35)
GFR calc Af Amer: >90 mL/min (ref >90)
Glucose, Bld: 91 mg/dL (ref 70–99)
Potassium: 4.5 mEq/L (ref 3.5–5.1)

## 2013-05-20 LAB — PROTIME-INR: Prothrombin Time: 12.2 seconds (ref 11.6–15.2)

## 2013-05-20 LAB — APTT: aPTT: 23 seconds — ABNORMAL LOW (ref 24–37)

## 2013-05-20 NOTE — Patient Instructions (Addendum)
[name]  05/20/2013   Your procedure is scheduled on: 12-12  -2014  Report to [facility] at    0900    AM.  Call this number if you have problems the morning of surgery: [phone]  Or Presurgical Testing [phone]([name])      Do not eat food:After Midnight.    Take these medicines the morning of surgery with A SIP OF WATER: none   Do not wear jewelry, make-up or nail polish.  Do not wear lotions, powders, or perfumes. You may wear deodorant.  Do not shave 12 hours prior to first CHG shower(legs and under arms).(face and neck okay.)  Do not bring valuables to the hospital.  Contacts, dentures or removable bridgework, body piercing, hair pins may not be worn into surgery.  Leave suitcase in the car. After surgery it may be brought to your room.  For patients admitted to the hospital, checkout time is 11:00 AM the day of discharge.   Patients discharged the day of surgery will not be allowed to drive home. Must have responsible person with you x 24 hours once discharged.  Name and phone number of your driver: [name]  Special Instructions: CHG(Chlorhedine 4%-"Hibiclens","Betasept","Aplicare") Shower Use Special Wash: see special instructions.(avoid face and genitals)   Please read over the following fact sheets that you were given: MRSA Information, Blood Transfusion fact sheet, Incentive Spirometry Instruction.  Remember : Type/Screen "Blue armbands" - may not be removed once applied(would result in being retested if removed).  Failure to follow these instructions may result in Cancellation of your surgery.   Patient signature_______________________________________________________

## 2013-05-25 ENCOUNTER — Other Ambulatory Visit (HOSPITAL_COMMUNITY): Payer: Self-pay

## 2013-05-27 ENCOUNTER — Inpatient Hospital Stay (HOSPITAL_COMMUNITY): Payer: BC Managed Care – PPO

## 2013-05-27 ENCOUNTER — Inpatient Hospital Stay (HOSPITAL_COMMUNITY): Payer: BC Managed Care – PPO | Admitting: Certified Registered"

## 2013-05-27 ENCOUNTER — Encounter (HOSPITAL_COMMUNITY)
Admission: RE | Disposition: A | Discharge status: Home Health | Payer: Self-pay | Source: Ambulatory Visit | Attending: Orthopaedic Surgery

## 2013-05-27 ENCOUNTER — Inpatient Hospital Stay (HOSPITAL_COMMUNITY)
Admission: RE | Admit: 2013-05-27 | Discharge: 2013-05-28 | DRG: 470 | Disposition: A | Discharge status: Home Health | Payer: BC Managed Care – PPO | Source: Ambulatory Visit | Attending: Orthopaedic Surgery | Admitting: Orthopaedic Surgery

## 2013-05-27 ENCOUNTER — Encounter (HOSPITAL_COMMUNITY): Payer: Self-pay

## 2013-05-27 ENCOUNTER — Encounter (HOSPITAL_COMMUNITY): Payer: BC Managed Care – PPO | Admitting: Certified Registered"

## 2013-05-27 DIAGNOSIS — Z01812 Encounter for preprocedural laboratory examination: Secondary | ICD-10-CM

## 2013-05-27 DIAGNOSIS — M1610 Unilateral primary osteoarthritis, unspecified hip: Principal | ICD-10-CM | POA: Diagnosis present

## 2013-05-27 DIAGNOSIS — Z96649 Presence of unspecified artificial hip joint: Secondary | ICD-10-CM

## 2013-05-27 DIAGNOSIS — M169 Osteoarthritis of hip, unspecified: Secondary | ICD-10-CM

## 2013-05-27 HISTORY — PX: TOTAL HIP ARTHROPLASTY: SHX124

## 2013-05-27 LAB — TYPE AND SCREEN

## 2013-05-27 SURGERY — ARTHROPLASTY, HIP, TOTAL, ANTERIOR APPROACH
Anesthesia: General | Site: Hip | Laterality: Left

## 2013-05-27 MED ORDER — GLYCOPYRROLATE 0.2 MG/ML IJ SOLN
INTRAMUSCULAR | Status: AC
  Filled 2013-05-27: qty 2

## 2013-05-27 MED ORDER — LACTATED RINGERS IV SOLN: INTRAVENOUS | Status: DC

## 2013-05-27 MED ORDER — SODIUM CHLORIDE 0.9 % IV SOLN
INTRAVENOUS | Status: DC
  Administered 2013-05-27 – 2013-05-28 (×2): via INTRAVENOUS

## 2013-05-27 MED ORDER — POLYETHYLENE GLYCOL 3350 17 G PO PACK: 17.0000 g | PACK | Freq: Every day | ORAL | Status: DC | PRN

## 2013-05-27 MED ORDER — CEFAZOLIN SODIUM 1-5 GM-% IV SOLN
1.0000 g | Freq: Four times a day (QID) | INTRAVENOUS | Status: AC
  Administered 2013-05-27 – 2013-05-28 (×2): 1 g via INTRAVENOUS
  Filled 2013-05-27 (×2): qty 50

## 2013-05-27 MED ORDER — ONDANSETRON HCL 4 MG/2ML IJ SOLN
INTRAMUSCULAR | Status: DC | PRN
  Administered 2013-05-27: 4 mg via INTRAVENOUS

## 2013-05-27 MED ORDER — DOCUSATE SODIUM 100 MG PO CAPS
100.0000 mg | ORAL_CAPSULE | Freq: Two times a day (BID) | ORAL | Status: DC
  Administered 2013-05-27 – 2013-05-28 (×2): 100 mg via ORAL

## 2013-05-27 MED ORDER — SODIUM CHLORIDE 0.9 % IR SOLN
Status: DC | PRN
  Administered 2013-05-27: 1000 mL

## 2013-05-27 MED ORDER — FENTANYL CITRATE 0.05 MG/ML IJ SOLN
INTRAMUSCULAR | Status: DC | PRN
  Administered 2013-05-27 (×2): 100 ug via INTRAVENOUS
  Administered 2013-05-27: 50 ug via INTRAVENOUS

## 2013-05-27 MED ORDER — ACETAMINOPHEN 650 MG RE SUPP: 650.0000 mg | Freq: Four times a day (QID) | RECTAL | Status: DC | PRN

## 2013-05-27 MED ORDER — CEFAZOLIN SODIUM-DEXTROSE 2-3 GM-% IV SOLR
2.0000 g | INTRAVENOUS | Status: AC
  Administered 2013-05-27: 2 g via INTRAVENOUS

## 2013-05-27 MED ORDER — SUCCINYLCHOLINE CHLORIDE 20 MG/ML IJ SOLN
INTRAMUSCULAR | Status: DC | PRN
  Administered 2013-05-27: 100 mg via INTRAVENOUS

## 2013-05-27 MED ORDER — MIDAZOLAM HCL 5 MG/5ML IJ SOLN
INTRAMUSCULAR | Status: DC | PRN
  Administered 2013-05-27: 2 mg via INTRAVENOUS

## 2013-05-27 MED ORDER — MENTHOL 3 MG MT LOZG: 1.0000 | LOZENGE | OROMUCOSAL | Status: DC | PRN

## 2013-05-27 MED ORDER — SODIUM CHLORIDE 0.9 % IJ SOLN
INTRAMUSCULAR | Status: AC
  Filled 2013-05-27: qty 10

## 2013-05-27 MED ORDER — METHOCARBAMOL 100 MG/ML IJ SOLN
500.0000 mg | Freq: Four times a day (QID) | INTRAVENOUS | Status: DC | PRN
  Administered 2013-05-27: 500 mg via INTRAVENOUS
  Filled 2013-05-27: qty 5

## 2013-05-27 MED ORDER — NEOSTIGMINE METHYLSULFATE 1 MG/ML IJ SOLN
INTRAMUSCULAR | Status: DC | PRN
  Administered 2013-05-27: 3 mg via INTRAVENOUS

## 2013-05-27 MED ORDER — HYDROMORPHONE HCL PF 1 MG/ML IJ SOLN
INTRAMUSCULAR | Status: AC
  Filled 2013-05-27: qty 1

## 2013-05-27 MED ORDER — ACETAMINOPHEN 325 MG PO TABS
650.0000 mg | ORAL_TABLET | Freq: Four times a day (QID) | ORAL | Status: DC | PRN
  Filled 2013-05-27: qty 2

## 2013-05-27 MED ORDER — PROMETHAZINE HCL 25 MG/ML IJ SOLN: 6.2500 mg | INTRAMUSCULAR | Status: DC | PRN

## 2013-05-27 MED ORDER — STERILE WATER FOR IRRIGATION IR SOLN
Status: DC | PRN
  Administered 2013-05-27: 1500 mL

## 2013-05-27 MED ORDER — MIDAZOLAM HCL 2 MG/2ML IJ SOLN
INTRAMUSCULAR | Status: AC
  Filled 2013-05-27: qty 2

## 2013-05-27 MED ORDER — 0.9 % SODIUM CHLORIDE (POUR BTL) OPTIME
TOPICAL | Status: DC | PRN
  Administered 2013-05-27: 1000 mL

## 2013-05-27 MED ORDER — ONDANSETRON HCL 4 MG PO TABS: 4.0000 mg | ORAL_TABLET | Freq: Four times a day (QID) | ORAL | Status: DC | PRN

## 2013-05-27 MED ORDER — DIPHENHYDRAMINE HCL 50 MG/ML IJ SOLN: 25.0000 mg | Freq: Four times a day (QID) | INTRAMUSCULAR | Status: DC | PRN

## 2013-05-27 MED ORDER — PROPOFOL 10 MG/ML IV BOLUS
INTRAVENOUS | Status: AC
  Filled 2013-05-27: qty 20

## 2013-05-27 MED ORDER — ADULT MULTIVITAMIN W/MINERALS CH
1.0000 | ORAL_TABLET | Freq: Every day | ORAL | Status: DC
  Administered 2013-05-28: 1 via ORAL
  Filled 2013-05-27 (×2): qty 1

## 2013-05-27 MED ORDER — HYDROMORPHONE HCL PF 2 MG/ML IJ SOLN
INTRAMUSCULAR | Status: AC
  Filled 2013-05-27: qty 1

## 2013-05-27 MED ORDER — PROPOFOL 10 MG/ML IV BOLUS
INTRAVENOUS | Status: DC | PRN
  Administered 2013-05-27: 150 mg via INTRAVENOUS

## 2013-05-27 MED ORDER — SUCCINYLCHOLINE CHLORIDE 20 MG/ML IJ SOLN
INTRAMUSCULAR | Status: AC
  Filled 2013-05-27: qty 1

## 2013-05-27 MED ORDER — METHOCARBAMOL 500 MG PO TABS: 500.0000 mg | ORAL_TABLET | Freq: Four times a day (QID) | ORAL | Status: DC | PRN

## 2013-05-27 MED ORDER — EPHEDRINE SULFATE 50 MG/ML IJ SOLN
INTRAMUSCULAR | Status: AC
  Filled 2013-05-27: qty 1

## 2013-05-27 MED ORDER — ROCURONIUM BROMIDE 100 MG/10ML IV SOLN
INTRAVENOUS | Status: AC
  Filled 2013-05-27: qty 1

## 2013-05-27 MED ORDER — ZOLPIDEM TARTRATE 5 MG PO TABS: 5.0000 mg | ORAL_TABLET | Freq: Every evening | ORAL | Status: DC | PRN

## 2013-05-27 MED ORDER — GLYCOPYRROLATE 0.2 MG/ML IJ SOLN
INTRAMUSCULAR | Status: DC | PRN
  Administered 2013-05-27: 0.4 mg via INTRAVENOUS

## 2013-05-27 MED ORDER — ONDANSETRON HCL 4 MG/2ML IJ SOLN
INTRAMUSCULAR | Status: AC
  Filled 2013-05-27: qty 2

## 2013-05-27 MED ORDER — METOCLOPRAMIDE HCL 10 MG PO TABS: 5.0000 mg | ORAL_TABLET | Freq: Three times a day (TID) | ORAL | Status: DC | PRN

## 2013-05-27 MED ORDER — ASPIRIN EC 325 MG PO TBEC
325.0000 mg | DELAYED_RELEASE_TABLET | Freq: Two times a day (BID) | ORAL | Status: DC
  Administered 2013-05-27 – 2013-05-28 (×2): 325 mg via ORAL
  Filled 2013-05-27 (×4): qty 1

## 2013-05-27 MED ORDER — DIPHENHYDRAMINE HCL 12.5 MG/5ML PO ELIX
12.5000 mg | ORAL_SOLUTION | ORAL | Status: DC | PRN
  Administered 2013-05-27: 25 mg via ORAL
  Filled 2013-05-27: qty 10

## 2013-05-27 MED ORDER — HYDROMORPHONE HCL PF 1 MG/ML IJ SOLN
INTRAMUSCULAR | Status: DC | PRN
  Administered 2013-05-27 (×2): 1 mg via INTRAVENOUS

## 2013-05-27 MED ORDER — FENTANYL CITRATE 0.05 MG/ML IJ SOLN
INTRAMUSCULAR | Status: AC
  Filled 2013-05-27: qty 5

## 2013-05-27 MED ORDER — ALUM & MAG HYDROXIDE-SIMETH 200-200-20 MG/5ML PO SUSP: 30.0000 mL | ORAL | Status: DC | PRN

## 2013-05-27 MED ORDER — HYDROMORPHONE HCL PF 1 MG/ML IJ SOLN: 1.0000 mg | INTRAMUSCULAR | Status: DC | PRN

## 2013-05-27 MED ORDER — LACTATED RINGERS IV SOLN
INTRAVENOUS | Status: DC | PRN
  Administered 2013-05-27 (×2): via INTRAVENOUS

## 2013-05-27 MED ORDER — METOCLOPRAMIDE HCL 5 MG/ML IJ SOLN: 5.0000 mg | Freq: Three times a day (TID) | INTRAMUSCULAR | Status: DC | PRN

## 2013-05-27 MED ORDER — ONDANSETRON HCL 4 MG/2ML IJ SOLN: 4.0000 mg | Freq: Four times a day (QID) | INTRAMUSCULAR | Status: DC | PRN

## 2013-05-27 MED ORDER — PHENOL 1.4 % MT LIQD: 1.0000 | OROMUCOSAL | Status: DC | PRN

## 2013-05-27 MED ORDER — CEFAZOLIN SODIUM-DEXTROSE 2-3 GM-% IV SOLR
INTRAVENOUS | Status: AC
  Filled 2013-05-27: qty 50

## 2013-05-27 MED ORDER — TRANEXAMIC ACID 100 MG/ML IV SOLN
1000.0000 mg | INTRAVENOUS | Status: AC
  Administered 2013-05-27: 1000 mg via INTRAVENOUS
  Filled 2013-05-27: qty 10

## 2013-05-27 MED ORDER — HYDROMORPHONE HCL PF 1 MG/ML IJ SOLN
0.2500 mg | INTRAMUSCULAR | Status: DC | PRN
  Administered 2013-05-27: 0.25 mg via INTRAVENOUS
  Administered 2013-05-27: 0.5 mg via INTRAVENOUS
  Administered 2013-05-27 (×2): 0.25 mg via INTRAVENOUS

## 2013-05-27 MED ORDER — EPHEDRINE SULFATE 50 MG/ML IJ SOLN
INTRAMUSCULAR | Status: DC | PRN
  Administered 2013-05-27: 10 mg via INTRAVENOUS

## 2013-05-27 MED ORDER — ROCURONIUM BROMIDE 100 MG/10ML IV SOLN
INTRAVENOUS | Status: DC | PRN
  Administered 2013-05-27: 30 mg via INTRAVENOUS
  Administered 2013-05-27: 10 mg via INTRAVENOUS

## 2013-05-27 MED ORDER — NEOSTIGMINE METHYLSULFATE 1 MG/ML IJ SOLN
INTRAMUSCULAR | Status: AC
  Filled 2013-05-27: qty 10

## 2013-05-27 MED ORDER — OXYCODONE HCL 5 MG PO TABS
5.0000 mg | ORAL_TABLET | ORAL | Status: DC | PRN
  Administered 2013-05-27 – 2013-05-28 (×4): 5 mg via ORAL
  Administered 2013-05-28: 10 mg via ORAL
  Administered 2013-05-28: 06:00:00 5 mg via ORAL
  Filled 2013-05-27 (×2): qty 1
  Filled 2013-05-27: qty 2
  Filled 2013-05-27 (×2): qty 1

## 2013-05-27 SURGICAL SUPPLY — 42 items
BAG ZIPLOCK 12X15 (MISCELLANEOUS) IMPLANT
BENZOIN TINCTURE PRP APPL 2/3 (GAUZE/BANDAGES/DRESSINGS) ×2 IMPLANT
BLADE SAW SGTL 18X1.27X75 (BLADE) ×2 IMPLANT
CAPT HIP PF COP ×2 IMPLANT
CELLS DAT CNTRL 66122 CELL SVR (MISCELLANEOUS) ×1 IMPLANT
DERMABOND ADVANCED (GAUZE/BANDAGES/DRESSINGS)
DERMABOND ADVANCED .7 DNX12 (GAUZE/BANDAGES/DRESSINGS) IMPLANT
DRAPE C-ARM 42X120 X-RAY (DRAPES) ×2 IMPLANT
DRAPE STERI IOBAN 125X83 (DRAPES) ×2 IMPLANT
DRAPE U-SHAPE 47X51 STRL (DRAPES) ×6 IMPLANT
DRSG AQUACEL AG ADV 3.5X10 (GAUZE/BANDAGES/DRESSINGS) ×2 IMPLANT
DRSG XEROFORM 1X8 (GAUZE/BANDAGES/DRESSINGS) ×2 IMPLANT
DURAPREP 26ML APPLICATOR (WOUND CARE) ×2 IMPLANT
ELECT BLADE TIP CTD 4 INCH (ELECTRODE) ×2 IMPLANT
ELECT REM PT RETURN 9FT ADLT (ELECTROSURGICAL) ×2
ELECTRODE REM PT RTRN 9FT ADLT (ELECTROSURGICAL) ×1 IMPLANT
ELIMINATOR HOLE APEX DEPUY (Hips) IMPLANT
FACESHIELD LNG OPTICON STERILE (SAFETY) ×8 IMPLANT
GAUZE XEROFORM 5X9 LF (GAUZE/BANDAGES/DRESSINGS) IMPLANT
GLOVE BIO SURGEON STRL SZ7.5 (GLOVE) ×2 IMPLANT
GLOVE BIOGEL PI IND STRL 8 (GLOVE) ×2 IMPLANT
GLOVE BIOGEL PI INDICATOR 8 (GLOVE) ×2
GLOVE ECLIPSE 8.0 STRL XLNG CF (GLOVE) ×2 IMPLANT
GOWN STRL REIN XL XLG (GOWN DISPOSABLE) ×4 IMPLANT
HANDPIECE INTERPULSE COAX TIP (DISPOSABLE) ×1
KIT BASIN OR (CUSTOM PROCEDURE TRAY) ×2 IMPLANT
PACK TOTAL JOINT (CUSTOM PROCEDURE TRAY) ×2 IMPLANT
PADDING CAST COTTON 6X4 STRL (CAST SUPPLIES) ×2 IMPLANT
RTRCTR WOUND ALEXIS 18CM MED (MISCELLANEOUS) ×2
SET HNDPC FAN SPRY TIP SCT (DISPOSABLE) ×1 IMPLANT
STAPLER VISISTAT 35W (STAPLE) IMPLANT
STRIP CLOSURE SKIN 1/2X4 (GAUZE/BANDAGES/DRESSINGS) ×2 IMPLANT
SUT ETHIBOND NAB CT1 #1 30IN (SUTURE) ×2 IMPLANT
SUT ETHILON 3 0 PS 1 (SUTURE) IMPLANT
SUT MNCRL AB 4-0 PS2 18 (SUTURE) IMPLANT
SUT VIC AB 0 CT1 36 (SUTURE) ×2 IMPLANT
SUT VIC AB 1 CT1 36 (SUTURE) ×2 IMPLANT
SUT VIC AB 2-0 CT1 27 (SUTURE) ×1
SUT VIC AB 2-0 CT1 TAPERPNT 27 (SUTURE) ×1 IMPLANT
TOWEL OR 17X26 10 PK STRL BLUE (TOWEL DISPOSABLE) ×2 IMPLANT
TOWEL OR NON WOVEN STRL DISP B (DISPOSABLE) ×2 IMPLANT
TRAY FOLEY CATH 14FRSI W/METER (CATHETERS) IMPLANT

## 2013-05-27 NOTE — Transfer of Care (Signed)
Immediate Anesthesia Transfer of Care Note  Patient: Earl Stanley  Procedure(s) Performed: Procedure(s) (LRB): LEFT TOTAL HIP ARTHROPLASTY ANTERIOR APPROACH (Left)  Patient Location: PACU  Anesthesia Type: General  Level of Consciousness: sedated, patient cooperative and responds to stimulation  Airway & Oxygen Therapy: Patient Spontanous Breathing and Patient connected to face mask oxgen  Post-op Assessment: Report given to PACU RN and Post -op Vital signs reviewed and stable  Post vital signs: Reviewed and stable  Complications: No apparent anesthesia complications

## 2013-05-27 NOTE — Progress Notes (Signed)
PACU Nsg Note: call received from Radiologist in regards to post op AP Lt hip x-ray, concerned of possible finding on film, informed pts Surgeon of phone call, orders rec'd to repeat AP film of Lt hip and distal end of new hip prothesis, order initiated and completed as per MD request  Juanda Crumble, RN, CPAN, CCRN

## 2013-05-27 NOTE — H&P (Signed)
TOTAL HIP ADMISSION H&P  Patient is admitted for left total hip arthroplasty.  Subjective:  Chief Complaint: left hip pain  HPI: Earl Stanley, 62 y.o. male, has a history of pain and functional disability in the left hip(s) due to arthritis and patient has failed non-surgical conservative treatments for greater than 12 weeks to include NSAID's and/or analgesics, corticosteriod injections, flexibility and strengthening excercises, supervised PT with diminished ADL's post treatment, use of assistive devices, weight reduction as appropriate and activity modification.  Onset of symptoms was gradual starting 4 years ago with gradually worsening course since that time.The patient noted no past surgery on the left hip(s).  Patient currently rates pain in the left hip at 9 out of 10 with activity. Patient has night pain, worsening of pain with activity and weight bearing, trendelenberg gait, pain that interfers with activities of daily living, pain with passive range of motion and joint swelling. Patient has evidence of subchondral cysts, subchondral sclerosis, periarticular osteophytes and joint space narrowing by imaging studies. This condition presents safety issues increasing the risk of falls.  There is no current active infection.  Patient Active Problem List   Diagnosis Date Noted  . Degenerative arthritis of hip 06/25/2012   Past Medical History  Diagnosis Date  . Arthritis     Past Surgical History  Procedure Laterality Date  . Hernia repair  2011    left inguinal hernia repair  . Nose surgery      chain saw accident on nose  . Total hip arthroplasty  06/25/2012    Procedure: TOTAL HIP ARTHROPLASTY ANTERIOR APPROACH;  Surgeon: Kathryne Hitch, MD;  Location: [facility] ORS;  Service: Orthopedics;  Laterality: Right;  Right Total Hip Arthroplasty, Anterior Approach (C-Arm)    No prescriptions prior to admission   No Known Allergies  History  Substance Use Topics  . Smoking status:  Never Smoker   . Smokeless tobacco: Never Used  . Alcohol Use: No    No family history on file.   Review of Systems  Musculoskeletal: Positive for joint pain.  All other systems reviewed and are negative.    Objective:  Physical Exam  Constitutional: He is oriented to person, place, and time. He appears well-developed and well-nourished.  HENT:  Head: Normocephalic and atraumatic.  Eyes: EOM are normal. Pupils are equal, round, and reactive to light.  Neck: Normal range of motion. Neck supple.  Cardiovascular: Normal rate and regular rhythm.   Respiratory: Effort normal and breath sounds normal.  GI: Soft. Bowel sounds are normal.  Musculoskeletal:       Left hip: He exhibits decreased range of motion, decreased strength and bony tenderness.  Neurological: He is alert and oriented to person, place, and time.  Skin: Skin is warm and dry.  Psychiatric: He has a normal mood and affect.    Vital signs in last 24 hours:    Labs:   Estimated body mass index is 24.28 kg/(m^2) as calculated from the following:   Height as of 06/25/12: 5\' 11"  (1.803 m).   Weight as of 06/17/12: 78.926 kg (174 lb).   Imaging Review Plain radiographs demonstrate severe degenerative joint disease of the left hip(s). The bone quality appears to be good for age and reported activity level.  Assessment/Plan:  End stage arthritis, left hip(s)  The patient history, physical examination, clinical judgement of the provider and imaging studies are consistent with end stage degenerative joint disease of the left hip(s) and total hip arthroplasty is deemed  medically necessary. The treatment options including medical management, injection therapy, arthroscopy and arthroplasty were discussed at length. The risks and benefits of total hip arthroplasty were presented and reviewed. The risks due to aseptic loosening, infection, stiffness, dislocation/subluxation,  thromboembolic complications and other  imponderables were discussed.  The patient acknowledged the explanation, agreed to proceed with the plan and consent was signed. Patient is being admitted for inpatient treatment for surgery, pain control, PT, OT, prophylactic antibiotics, VTE prophylaxis, progressive ambulation and ADL's and discharge planning.The patient is planning to be discharged home with home health services

## 2013-05-27 NOTE — Preoperative (Signed)
Beta Blockers   Reason not to administer Beta Blockers:Not Applicable 

## 2013-05-27 NOTE — Brief Op Note (Signed)
05/27/2013  2:22 PM  PATIENT:  Earl Stanley  62 y.o. male  PRE-OPERATIVE DIAGNOSIS:  Severe osteoarthritis left hip  POST-OPERATIVE DIAGNOSIS:  Severe osteoarthritis left hip  PROCEDURE:  Procedure(s): LEFT TOTAL HIP ARTHROPLASTY ANTERIOR APPROACH (Left)  SURGEON:  Surgeon(s) and Role:    * Kathryne Hitch, MD - Primary  PHYSICIAN ASSISTANT: Rexene Edison, PA-C  ANESTHESIA:   general  EBL:  Total I/O In: 1000 [I.V.:1000] Out: 550 [Blood:550]  BLOOD ADMINISTERED:none  DRAINS: none   LOCAL MEDICATIONS USED:  NONE  SPECIMEN:  No Specimen and Modified Radical Mastectomy  DISPOSITION OF SPECIMEN:  N/A  COUNTS:  YES  TOURNIQUET:  * No tourniquets in log *  DICTATION: .Other Dictation: Dictation Number [id number]  PLAN OF CARE: Admit to inpatient   PATIENT DISPOSITION:  PACU - hemodynamically stable.   Delay start of Pharmacological VTE agent (>24hrs) due to surgical blood loss or risk of bleeding: no

## 2013-05-27 NOTE — Anesthesia Preprocedure Evaluation (Signed)
Anesthesia Evaluation  Patient identified by MRN, date of birth, ID band Patient awake    Reviewed: Allergy & Precautions, H&P , NPO status , Patient's Chart, lab work & pertinent test results  Airway Mallampati: I TM Distance: >3 FB Neck ROM: Full    Dental  (+) Teeth Intact and Dental Advisory Given   Pulmonary neg pulmonary ROS,  breath sounds clear to auscultation  Pulmonary exam normal       Cardiovascular negative cardio ROS  Rhythm:Regular Rate:Normal     Neuro/Psych negative neurological ROS  negative psych ROS   GI/Hepatic negative GI ROS, Neg liver ROS,   Endo/Other  negative endocrine ROS  Renal/GU negative Renal ROS  negative genitourinary   Musculoskeletal negative musculoskeletal ROS (+)   Abdominal   Peds  Hematology negative hematology ROS (+)   Anesthesia Other Findings   Reproductive/Obstetrics                           Anesthesia Physical Anesthesia Plan  ASA: II  Anesthesia Plan: General   Post-op Pain Management:    Induction: Intravenous  Airway Management Planned: Oral ETT  Additional Equipment:   Intra-op Plan:   Post-operative Plan: Extubation in OR  Informed Consent: I have reviewed the patients History and Physical, chart, labs and discussed the procedure including the risks, benefits and alternatives for the proposed anesthesia with the patient or authorized representative who has indicated his/her understanding and acceptance.   Dental advisory given  Plan Discussed with: CRNA  Anesthesia Plan Comments:         Anesthesia Quick Evaluation

## 2013-05-27 NOTE — Anesthesia Postprocedure Evaluation (Signed)
Anesthesia Post Note  Patient: Earl Stanley  Procedure(s) Performed: Procedure(s) (LRB): LEFT TOTAL HIP ARTHROPLASTY ANTERIOR APPROACH (Left)  Anesthesia type: General  Patient location: PACU  Post pain: Pain level controlled  Post assessment: Post-op Vital signs reviewed  Last Vitals:  Filed Vitals:   05/27/13 1700  BP: 97/67  Pulse: 50  Temp: 36.5 C  Resp: 16    Post vital signs: Reviewed  Level of consciousness: sedated  Complications: No apparent anesthesia complications

## 2013-05-28 LAB — CBC
HCT: 30.6 % — ABNORMAL LOW (ref 39.0–52.0)
MCHC: 34 g/dL (ref 30.0–36.0)
Platelets: 157 10*3/uL (ref 150–400)
RBC: 3.75 MIL/uL — ABNORMAL LOW (ref 4.22–5.81)
RDW: 13.5 % (ref 11.5–15.5)
WBC: 9.2 10*3/uL (ref 4.0–10.5)

## 2013-05-28 LAB — BASIC METABOLIC PANEL
CO2: 25 mEq/L (ref 19–32)
Calcium: 8.2 mg/dL — ABNORMAL LOW (ref 8.4–10.5)
Chloride: 99 mEq/L (ref 96–112)
GFR calc Af Amer: >90 mL/min (ref >90)
GFR calc non Af Amer: 88 mL/min — ABNORMAL LOW (ref >90)
Potassium: 3.9 mEq/L (ref 3.5–5.1)
Sodium: 133 mEq/L — ABNORMAL LOW (ref 135–145)

## 2013-05-28 MED ORDER — HYDROCODONE-ACETAMINOPHEN 5-325 MG PO TABS: 1.0000 | ORAL_TABLET | Freq: Four times a day (QID) | ORAL | Status: DC | PRN

## 2013-05-28 MED ORDER — METHOCARBAMOL 500 MG PO TABS: 500.0000 mg | ORAL_TABLET | Freq: Four times a day (QID) | ORAL | Status: DC | PRN

## 2013-05-28 MED ORDER — DOXYCYCLINE HYCLATE 50 MG PO CAPS: 50.0000 mg | ORAL_CAPSULE | Freq: Two times a day (BID) | ORAL | Status: DC

## 2013-05-28 MED ORDER — ASPIRIN 325 MG PO TBEC: 325.0000 mg | DELAYED_RELEASE_TABLET | Freq: Two times a day (BID) | ORAL | Status: DC

## 2013-05-28 NOTE — Op Note (Signed)
NAMEDONYALE, Earl Stanley NO.:  [id number]  MEDICAL RECORD NO.:  [id number]  LOCATION:  1608                         FACILITY:  [facility]  PHYSICIAN:  Vanita Panda. Magnus Ivan, M.D.DATE OF BIRTH:  October 02, 1950  DATE OF PROCEDURE:  05/27/2013 DATE OF DISCHARGE:                              OPERATIVE REPORT   PREOPERATIVE DIAGNOSIS:  Severe end-stage arthritis and degenerative joint disease, left hip.  POSTOPERATIVE DIAGNOSIS:  Severe end-stage arthritis and degenerative joint disease, left hip.  PROCEDURE:  Left total hip arthroplasty through direct anterior approach.  IMPLANTS:  DePuy Sector Gription acetabular component size 56, apex hole eliminator guide, size 36+ 4 neutral polyethylene liner, size 13 Corail femoral component with standard offset, size 36+ 5 ceramic hip ball.  SURGEON:  Vanita Panda. Magnus Ivan, M.D.  ASSISTANT:  Kriste Basque, PA-C.  ANESTHESIA:  General.  ANTIBIOTICS:  2 g of IV Ancef.  BLOOD LOSS:  550 mL.  COMPLICATIONS:  None.  INDICATIONS:  Mr. Feenstra is a 62 year old healthy individual with severe osteoarthritis of both his hips.  He underwent a successful right total hip arthroplasty in January of this year, he presents to have the left hip replaced.  The left hip is very painful to him.  It has affected his activities of daily living and his mobility.  At this point, with decrease in quality of life he wishes to proceed with a left total hip arthroplasty.  He understands fully the risks of acute blood loss anemia, nerve and vessel injury, fracture, infection, dislocation, as well as DVT.  He understands the goals are decreased pain, improved mobility, and overall improved quality of life.  DESCRIPTION OF PROCEDURE:  After informed consent was obtained, appropriate left hip was marked.  He was brought to the operating room and general anesthesia was obtained while he was on the stretcher. Traction boots were then applied on both his  feet and he was next placed supine on the HANA fracture table with the perineal post in place and both legs in inline skeletal traction devices, but no traction applied. Time-out was called.  We then assessed the left hip under direct fluoroscopy.  We prepped and draped the left hip in the fluoroscopic unit with DuraPrep and sterile drapes.  Time-out was called and he was identified as correct patient and correct left hip.  We then made an incision inferior and posterior to the anterior-superior iliac spine and carried this obliquely down the leg.  We dissected down to the tensor fascia lata muscle then the tensor fascia was divided longitudinally. We then proceeded with a direct anterior approach to the hip.  We cauterized the lateral femoral circumflex vessels and then placed Cobra retractors around the lateral neck and around the medial neck.  We opened up the hip capsule in L-type format placing the Cobra retractors within the hip capsule.  We found significant arthritic changes around the hip.  We made a femoral neck cut with an oscillating saw just proximal to the lesser trochanter and completed this with an osteotome. We placed a corkscrew guide in the femoral head and removed the femoral head in its entirety.  We then cleaned the acetabulum of debris and  we found a femoral head devoid of cartilage.  We removed remnants of the labrum and periarticular osteophytes.  We then began reaming from a size 42 reamer in 2 mm increments all the way up to a size 56 with all reamers placed under direct visualization and last reamers under direct fluoroscopy.  We then placed a real DePuy Sector Gription acetabular component size 56, it was nice and stable.  We placed the apex hole eliminator guide and then the 36+ 4 neutral polyethylene liner. Attention was then turned to the femur with the leg externally rotated in 100 degrees, extended, and adducted, we were able to place a Mueller retractor  medially and a Hohmann retractor behind the greater trochanter, released the lateral joint capsule, and then used a box cutting osteotome to enter the femoral canal and a rongeur to lateralize and then began broaching from a size 8 broach all the way up to a size 13.  With the size 13 broach, we trialed a standard neck and a 36+ 5 hip ball due to a little bit lower neck cut in the other side, we went back over up with traction and internal rotation reducing the pelvis, it was stable throughout its arc of internal and external rotation, had minimal shuck and leg lengths were measured near equal as well as also under some direct fluoroscopy.  We then dislocated the hip and removed the trial components.  We placed the real Corail femoral component with standard offset size 13 and the real 36+ 5 ceramic hip ball and reduced this into acetabulum and it was stable.  We then copiously irrigated the wound and deep and superficial tissue with normal saline solution using pulsatile lavage.  We closed the joint capsule with interrupted #1 Ethibond suture followed by a running #1 Vicryl in the tensor fascia, 0- Vicryl in the deep tissue, 2-0 Vicryl in the subcutaneous tissue, 4-0 Monocryl subcuticular stitch, Steri-Strips, and Aquacel dressing.  He was then taken off the HANA table, awakened, extubated, and taken to the recovery room in stable condition.  All final counts were correct. There were no complications noted.  Of note, Kriste Basque, PA-C was present during the entire case and his presence was crucial for the patient positioning, retracting, and closure of the wound.     Vanita Panda. Magnus Ivan, M.D.     CYB/MEDQ  D:  05/27/2013  T:  05/28/2013  Job:  [id number]

## 2013-05-28 NOTE — Evaluation (Signed)
Physical Therapy One Time Evaluation Patient Details Name: Earl Stanley MRN: [id number] DOB: Mar 06, 1951 Today's Date: 05/28/2013 Time: 7846-9629 PT Time Calculation (min): 27 min  PT Assessment / Plan / Recommendation History of Present Illness     Clinical Impression  Pt is s/p L direct anterior THR.  Pt and nsg report pt has been up around room this morning prior to arrival without calling for assist.  Pt ambulated in hallway, practiced one step and educated on and performed exercises.  Pt also provided with HEP as he states HHPT took 5 days to get to his house after his previous R hip surgery.  Pt presents as safety concern due to lines and speed so educated to slow down and use caution.  Pt reports after ambulation that he plans to use crutches upon d/c as he has those at home and spouse states he uses them well and able to perform step with them as well.  Pt declines RW upon d/c.  Pt would like to d/c home today and all education completed so acute PT to sign off.    PT Assessment  All further PT needs can be met in the next venue of care    Follow Up Recommendations  Home health PT    Does the patient have the potential to tolerate intense rehabilitation      Barriers to Discharge        Equipment Recommendations  None recommended by PT    Recommendations for Other Services     Frequency      Precautions / Restrictions Precautions Precautions: Fall Restrictions Other Position/Activity Restrictions: L LE WBAT   Pertinent Vitals/Pain 1/10 L hip pain end of session, states pain much better after mobility      Mobility  Bed Mobility Bed Mobility: Not assessed Details for Bed Mobility Assistance: however pt and staff report pt has been getting in/out of bed without assist Transfers Transfers: Sit to Stand;Stand to Sit Sit to Stand: 5: Supervision;From chair/3-in-1 Stand to Sit: 5: Supervision;To chair/3-in-1 Details for Transfer Assistance: supervision for safety  as pt attempting to get out of recliner partway reclined Ambulation/Gait Ambulation/Gait Assistance: 5: Supervision Ambulation Distance (Feet): 160 Feet Assistive device: Rolling walker Ambulation/Gait Assistance Details: verbal cues for hip/knee flexion and heel strike on L LE however pt with decreased strength so placed foot flat, verbal cues for safe technique Gait Pattern: Step-through pattern;Decreased stride length;Decreased hip/knee flexion - left;Decreased dorsiflexion - left Stairs: Yes Stairs Assistance: 5: Supervision Stairs Assistance Details (indicate cue type and reason): verbal cues for safe technique especially getting close to step before advancing LE Stair Management Technique: Step to pattern;Forwards;With walker Number of Stairs: 1 (twice)    Exercises Total Joint Exercises Ankle Circles/Pumps: AROM;Both;15 reps;Supine Quad Sets: AROM;Both;15 reps;Supine Gluteal Sets: AROM;Both;15 reps;Supine Heel Slides: AAROM;Left;15 reps;Supine Hip ABduction/ADduction: AAROM;Left;15 reps;Supine Long Arc Quad: AROM;Left;15 reps;Seated   PT Diagnosis: Difficulty walking  PT Problem List: Decreased range of motion;Decreased strength;Decreased mobility PT Treatment Interventions:       PT Goals(Current goals can be found in the care plan section) Acute Rehab PT Goals PT Goal Formulation: No goals set, d/c therapy  Visit Information  Last PT Received On: 05/28/13 Assistance Needed: +1       Prior Functioning  Home Living Family/patient expects to be discharged to:: Private residence Living Arrangements: Spouse/significant other Type of Home: House Home Access: Stairs to enter Secretary/administrator of Steps: 1 Entrance Stairs-Rails: None Home Layout: One level Home Equipment: Crutches  Prior Function Level of Independence: Independent Communication Communication: No difficulties    Cognition  Cognition Arousal/Alertness: Awake/alert Behavior During Therapy: WFL  for tasks assessed/performed Overall Cognitive Status: Within Functional Limits for tasks assessed Area of Impairment: Safety/judgement Safety/Judgement: Decreased awareness of safety    Extremity/Trunk Assessment Lower Extremity Assessment Lower Extremity Assessment: LLE deficits/detail LLE Deficits / Details: 2+/5 hip flexion (able to lift somewhat against gravity), functional hip weakness observed   Balance    End of Session PT - End of Session Activity Tolerance: Patient tolerated treatment well Patient left: in chair;with call bell/phone within reach;with family/visitor present  GP     Hanh Kertesz,KATHrine E 05/28/2013, 1:00 PM Zenovia Jarred, PT, DPT 05/28/2013 Pager: [phone]

## 2013-05-28 NOTE — Discharge Summary (Signed)
Patient ID: Earl Stanley MRN: [id number] DOB/AGE: June 05, 1951 62 y.o.  Admit date: 05/27/2013 Discharge date: 05/28/2013  Admission Diagnoses:  Principal Problem:   Degenerative arthritis of hip Active Problems:   Status post THR (total hip replacement)   Discharge Diagnoses:  S/p left total hip arthroplasty anterior approach  Past Medical History  Diagnosis Date  . Arthritis     Surgeries: Procedure(s): LEFT TOTAL HIP ARTHROPLASTY ANTERIOR APPROACH on 05/27/2013   Consultants:  PT  Discharged Condition: Improved  Hospital Course: OMARRION CARMER is an 62 y.o. male who was admitted 05/27/2013 for operative treatment ofDegenerative arthritis of hip. Patient has severe unremitting pain that affects sleep, daily activities, and work/hobbies. After pre-op clearance the patient was taken to the operating room on 05/27/2013 and underwent  Procedure(s): LEFT TOTAL HIP ARTHROPLASTY ANTERIOR APPROACH.    Patient was given perioperative antibiotics: Anti-infectives   Start     Dose/Rate Route Frequency Ordered Stop   05/28/13 0000  doxycycline (VIBRAMYCIN) 50 MG capsule     50 mg Oral 2 times daily 05/28/13 1202     05/27/13 1830  ceFAZolin (ANCEF) IVPB 1 g/50 mL premix     1 g 100 mL/hr over 30 Minutes Intravenous Every 6 hours 05/27/13 1637 05/28/13 0054   05/27/13 0851  ceFAZolin (ANCEF) IVPB 2 g/50 mL premix     2 g 100 mL/hr over 30 Minutes Intravenous On call to O.R. 05/27/13 0851 05/27/13 1250       Patient was given sequential compression devices, early ambulation, and chemoprophylaxis to prevent DVT.  Patient benefited maximally from hospital stay and there were no complications.    Recent vital signs: Patient Vitals for the past 24 hrs:  BP Temp Temp src Pulse Resp SpO2 Height Weight  05/28/13 1037 107/58 mmHg 99.2 F (37.3 C) Oral 78 16 96 % - -  05/28/13 0800 - - - - 16 96 % - -  05/28/13 0542 100/63 mmHg 98.7 F (37.1 C) Oral 77 16 99 % - -  05/28/13  0400 - - - - 18 99 % - -  05/28/13 0121 112/50 mmHg 97.2 F (36.2 C) Oral 93 18 99 % - -  05/28/13 0000 - - - - 20 - - -  05/27/13 2034 122/82 mmHg 97.6 F (36.4 C) Oral 56 16 98 % 5\' 11"  (1.803 m) 78.132 kg (172 lb 4 oz)  05/27/13 2000 - - - - 18 - - -  05/27/13 1907 112/54 mmHg 98.1 F (36.7 C) - 63 16 100 % - -  05/27/13 1810 97/58 mmHg 97.9 F (36.6 C) Oral 50 16 100 % - -  05/27/13 1700 97/67 mmHg 97.7 F (36.5 C) - 50 16 100 % - -  05/27/13 1606 105/58 mmHg - - 50 16 100 % - -  05/27/13 1545 113/56 mmHg 97.8 F (36.6 C) - - - - - -  05/27/13 1500 109/58 mmHg - - - - - - -  05/27/13 1444 106/43 mmHg 97.7 F (36.5 C) - 58 16 100 % - -     Recent laboratory studies:  Recent Labs  05/28/13 0427  WBC 9.2  HGB 10.4*  HCT 30.6*  PLT 157  NA 133*  K 3.9  CL 99  CO2 25  BUN 16  CREATININE 0.97  GLUCOSE 113*  CALCIUM 8.2*     Discharge Medications:     Medication List         aspirin 325 MG EC  tablet  Take 1 tablet (325 mg total) by mouth 2 (two) times daily after a meal.     doxycycline 50 MG capsule  Commonly known as:  VIBRAMYCIN  Take 1 capsule (50 mg total) by mouth 2 (two) times daily.     HYDROcodone-acetaminophen 5-325 MG per tablet  Commonly known as:  NORCO  Take 1 tablet by mouth every 6 (six) hours as needed for moderate pain. One to two tabs every 4-6 hours for pain     methocarbamol 500 MG tablet  Commonly known as:  ROBAXIN  Take 1 tablet (500 mg total) by mouth every 6 (six) hours as needed for muscle spasms.     multivitamin with minerals Tabs tablet  Take 1 tablet by mouth daily.        Diagnostic Studies: Dg Hip Complete Left  05/27/2013   CLINICAL DATA:  Pleural left hip replacement  EXAM: LEFT HIP - COMPLETE 2+ VIEW fluoro time is 0.4 min.  COMPARISON:  None.  FINDINGS: Bilateral hip replacements are identified. There is no evidence of malalignment or dislocation.  IMPRESSION: Left hip replacement without malalignment.    Electronically Signed   By: Sherian Rein M.D.   On: 05/27/2013 14:31   Dg Pelvis Portable  05/27/2013   CLINICAL DATA:  The patient has undergone anterior approach left total hip joint replacement.  EXAM: PORTABLE PELVIS 1-2 VIEWS  COMPARISON:  None.  FINDINGS: The prosthetic left hip joint appears in reasonable radiographic position. The tip of the prosthesis is not included in the field of view. The pre-existing right femoral prosthesis appears normally positioned radiographically. The bony pelvis exhibits no acute abnormality.  IMPRESSION: The patient has undergone left hip joint replacement. No immediate postprocedure complication is demonstrated.   Electronically Signed   By: David  [place]   On: 05/27/2013 15:24   Dg Hip Portable 1 View Left  05/27/2013   CLINICAL DATA:  The patient has undergone anterior approach left hip joint prosthesis placement.  EXAM: PORTABLE LEFT HIP - 1 VIEW  COMPARISON:  None.  FINDINGS: A prosthetic left hip joint is in place. The radiographic position of the prosthetic components is acceptable. The femoral component appears intact. There is a obliquely oriented lucency extending to the cortical surface posteriorly adjacent to the tip of the femoral component of the prosthesis. This may reflect a nutrient foramen, but a nondisplaced fracture is not absolutely excluded.  IMPRESSION: There may be a nondisplaced fracture involving the posterior cortex of the proximal femur adjacent to the distal aspect of the femoral component of the prosthesis. These results as well as the AP pelvis film results were called by a me by telephone at the time of interpretation on 05/27/2013 at 3:30 PM to Jerrell Belfast, RN,, who verbally acknowledged these results.   Electronically Signed   By: David  [place]   On: 05/27/2013 15:31   Dg C-arm 1-60 Min-no Report  05/27/2013   CLINICAL DATA: left total hip replacement   C-ARM 1-60 MINUTES  Fluoroscopy was utilized by the requesting physician.   No radiographic  interpretation.     Disposition: 01-Home or Self Care      Discharge Orders   Future Orders Complete By Expires   Discharge wound care:  As directed    Comments:     Keep dressing clean and intact. May shower with dressing intact . Remove dressing Thursday and shower. After showering apply clean dressing.   Weight bearing as tolerated  As directed  Questions:     Laterality:     Extremity:        Follow-up Information   Follow up with Kathryne Hitch, MD In 2 weeks.   Specialty:  Orthopedic Surgery   Contact information:   [address] [place] [address] [phone]        Signed: Richardean Canal 05/28/2013, 12:03 PM

## 2013-05-28 NOTE — Progress Notes (Signed)
OT Cancellation Note  Patient Details Name: Earl Stanley MRN: [id number] DOB: 12-26-1950   Cancelled Treatment:    Reason Eval/Treat Not Completed: OT screened, no needs identified, will sign off  Jalyssa Fleisher A 05/28/2013, 12:39 PM

## 2013-05-28 NOTE — Progress Notes (Signed)
05/28/2013 1400 NCM spoke to pt's wife. Requested call back on 05/29/2013. NCM contacted pt to arrange [facility]. Isidoro Donning RN CCM Case Mgmt phone [phone]

## 2013-05-28 NOTE — Progress Notes (Signed)
Subjective: 1 Day Post-Op Procedure(s) (LRB): LEFT TOTAL HIP ARTHROPLASTY ANTERIOR APPROACH (Left) Patient reports pain as mild.   Has been up out of bed by himself despite staff asking him to call for help. Wants to go home today. Objective: Vital signs in last 24 hours: Temp:  [97.2 F (36.2 C)-99.2 F (37.3 C)] 99.2 F (37.3 C) (12/13 1037) Pulse Rate:  [50-93] 78 (12/13 1037) Resp:  [16-20] 16 (12/13 1037) BP: (97-122)/(43-82) 107/58 mmHg (12/13 1037) SpO2:  [96 %-100 %] 96 % (12/13 1037) Weight:  [78.132 kg (172 lb 4 oz)] 78.132 kg (172 lb 4 oz) (12/12 2034)  Intake/Output from previous day: 12/12 0701 - 12/13 0700 In: 3030 [P.O.:240; I.V.:2635; IV Piggyback:155] Out: 1475 [Urine:925; Blood:550] Intake/Output this shift: Total I/O In: 240 [P.O.:240] Out: 700 [Urine:700]   Recent Labs  05/28/13 0427  HGB 10.4*    Recent Labs  05/28/13 0427  WBC 9.2  RBC 3.75*  HCT 30.6*  PLT 157    Recent Labs  05/28/13 0427  NA 133*  K 3.9  CL 99  CO2 25  BUN 16  CREATININE 0.97  GLUCOSE 113*  CALCIUM 8.2*   No results found for this basename: LABPT, INR,  in the last 72 hours  Left lower leg: Neurovascular intact Dorsiflexion/Plantar flexion intact Incision: dressing C/D/I Compartment soft  Assessment/Plan: 1 Day Post-Op Procedure(s) (LRB): LEFT TOTAL HIP ARTHROPLASTY ANTERIOR APPROACH (Left) Discharge home with home health  Richardean Canal 05/28/2013, 11:50 AM

## 2013-05-29 NOTE — Progress Notes (Signed)
   CARE MANAGEMENT NOTE 05/29/2013  Patient:  Earl Stanley, Earl Stanley   Account Number:  [id number]  Date Initiated:  05/28/2013  Documentation initiated by:  [facility]  Subjective/Objective Assessment:   S/p left total hip arthroplasty anterior approach     Action/Plan:   [facility]   Anticipated DC Date:  05/28/2013   Anticipated DC Plan:  HOME W HOME HEALTH SERVICES      DC Planning Services  CM consult      [facility] Choice  HOME HEALTH   Choice offered to / List presented to:  C-3 Spouse        HH arranged  HH-2 PT      [facility] agency  [facility]   Status of service:  Completed, signed off Medicare Important Message given?   (If response is "NO", the following Medicare IM given date fields will be blank) Date Medicare IM given:   Date Additional Medicare IM given:    Discharge Disposition:  HOME W HOME HEALTH SERVICES  Per UR Regulation:    If discussed at Long Length of Stay Meetings, dates discussed:    Comments:  05/29/2013 1245 HH arranged with Genevieve Norlander. Genevieve Norlander will contact pt and discuss soc date. Isidoro Donning RN CCM Case Mgmt phone [phone]  05/28/2013 1400 NCM spoke to pt's wife. Requested call back on 05/29/2013. NCM contacted pt to arrange [facility]. Isidoro Donning RN CCM Case Mgmt phone [phone]

## 2014-12-01 IMAGING — RF DG HIP COMPLETE 2+V*R*
1 series · 2 of 2 positions shown · non-contrast
Comparison: None.

CLINICAL DATA: Total hip replacement on the right

RIGHT HIP - COMPLETE 2+ VIEW

[Series 1: run · 2 of 2 slices shown]
[im 1/2]
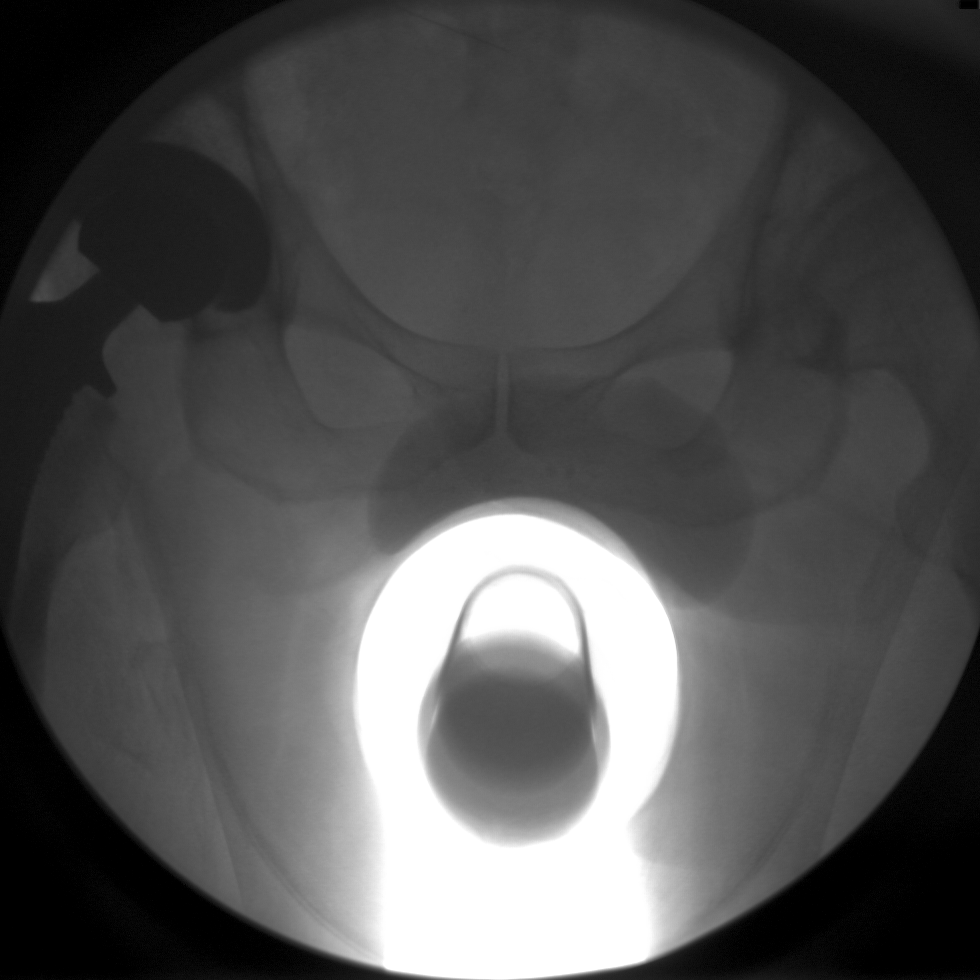
[im 2/2]
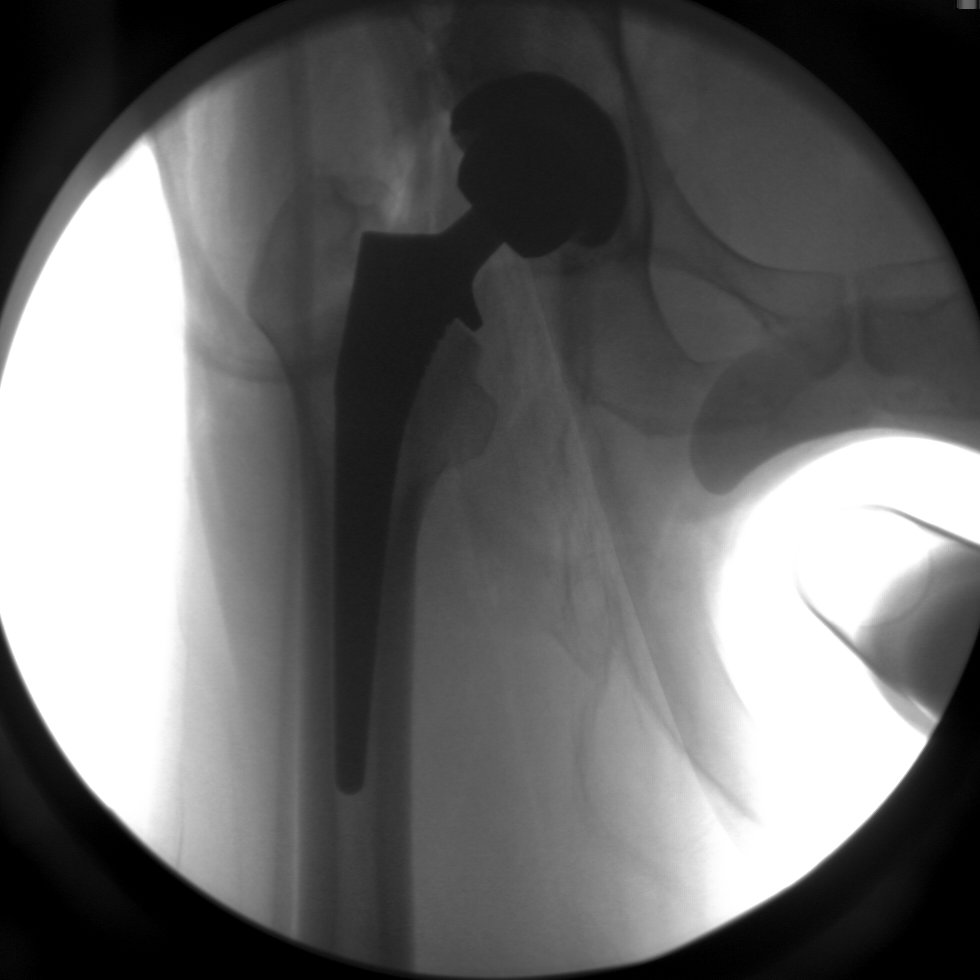

[2 of 2 positions shown; findings below may reference images not displayed]

FINDINGS: Two C-arm images show a right total hip replacement with
components grossly well positioned.  No radiographically detectable
complication.
IMPRESSION: Good appearance following total hip replacement on the right.

## 2014-12-01 IMAGING — CR DG HIP 1V PORT*R*
1 series · 1 of 1 positions shown · non-contrast
Comparison: 06/25/2012 at [DATE] a.m.

CLINICAL DATA: Hip replacement.

PORTABLE RIGHT HIP - 1 VIEW

[AP]
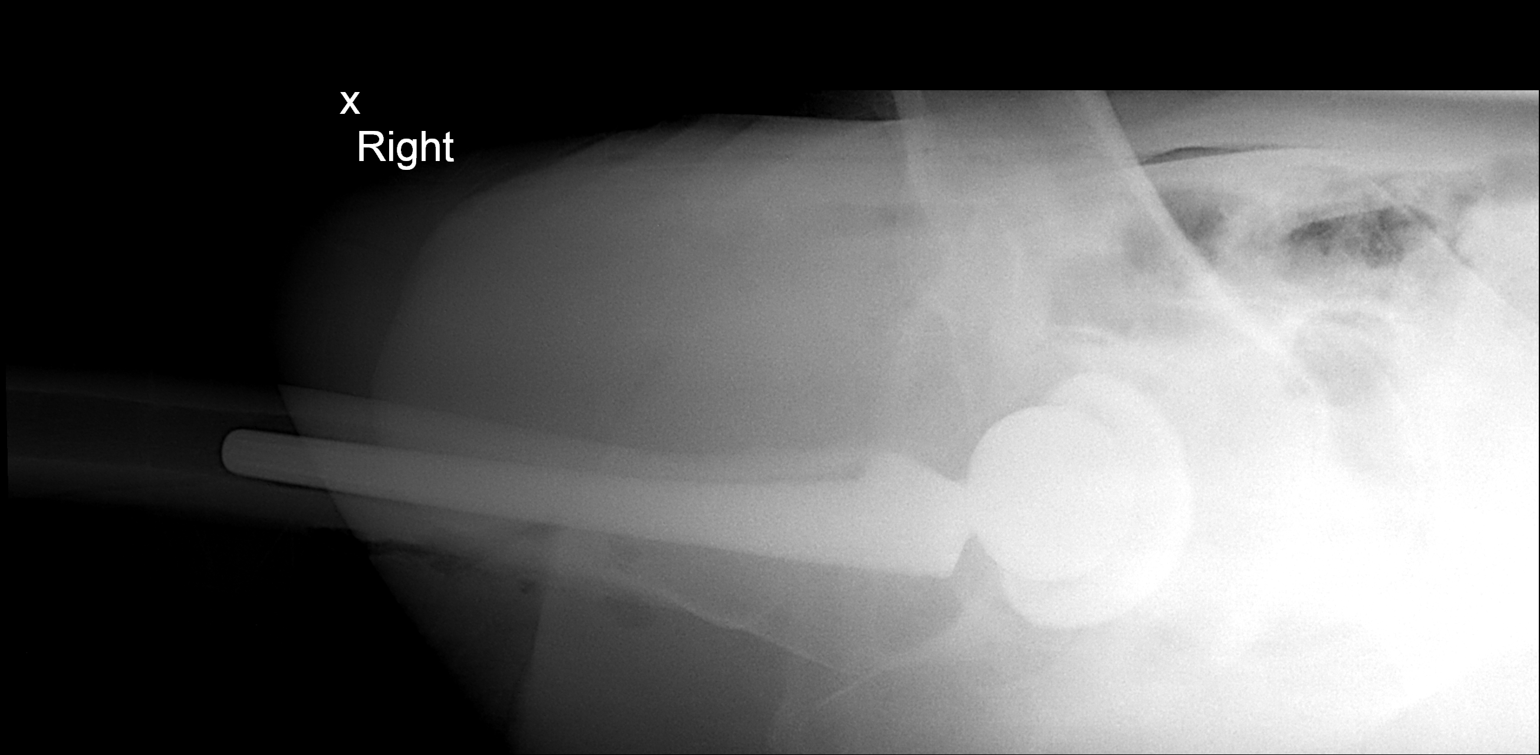

[1 of 1 positions shown; findings below may reference images not displayed]

FINDINGS: A cross-table lateral view of the right hip demonstrates
expected alignment of the stem of the hip implant with the proximal
femur.  No dislocation, fracture or, or complicating feature.
IMPRESSION: 1.  Cross-table lateral image demonstrates no complicating feature
regarding the right hip implant.

## 2017-04-20 ENCOUNTER — Telehealth (INDEPENDENT_AMBULATORY_CARE_PROVIDER_SITE_OTHER): Payer: Self-pay | Admitting: Orthopaedic Surgery

## 2017-04-20 NOTE — Telephone Encounter (Signed)
Returned call to patient left message for a return call °

## 2017-05-21 ENCOUNTER — Ambulatory Visit (INDEPENDENT_AMBULATORY_CARE_PROVIDER_SITE_OTHER): Payer: Medicare Other

## 2017-05-21 ENCOUNTER — Ambulatory Visit (INDEPENDENT_AMBULATORY_CARE_PROVIDER_SITE_OTHER): Payer: Medicare Other | Admitting: Orthopaedic Surgery

## 2017-05-21 ENCOUNTER — Encounter (INDEPENDENT_AMBULATORY_CARE_PROVIDER_SITE_OTHER): Payer: Self-pay | Admitting: Orthopaedic Surgery

## 2017-05-21 DIAGNOSIS — Z96643 Presence of artificial hip joint, bilateral: Secondary | ICD-10-CM | POA: Diagnosis not present

## 2017-05-21 DIAGNOSIS — M25551 Pain in right hip: Secondary | ICD-10-CM

## 2017-05-21 MED ORDER — NABUMETONE 500 MG PO TABS: 500.0000 mg | ORAL_TABLET | Freq: Two times a day (BID) | ORAL | 1 refills | Status: DC | PRN

## 2017-05-21 MED ORDER — METHYLPREDNISOLONE 4 MG PO TABS: ORAL_TABLET | ORAL | 0 refills | Status: DC

## 2017-05-21 NOTE — Progress Notes (Signed)
Office Visit Note   Patient: Earl Stanley           Date of Birth: 02/28/1951           MRN: [id number] Visit Date: 05/21/2017              Requested by: No referring provider defined for this encounter. PCP: Earl Stanley, Candice, NP   Assessment & Plan: Visit Diagnoses:  1. Status post total replacement of both hips   2. Pain in right hip     Plan: From what patient describes as feels like this is more of a hamstring tear at the insertion of the hamstring.  He 66 years old and active but is every 2-3 months into this and so we will likely do better with time.  He denies any groin pain any numbness tingling going down either leg.  I will try some anti-inflammatories for him.  All questions concerns were answered and addressed.  He will follow-up as needed.  Follow-Up Instructions: Return if symptoms worsen or fail to improve.   Orders:  Orders Placed This Encounter  Procedures  . XR HIP UNILAT W OR W/O PELVIS 1V RIGHT  . XR Lumbar Spine 2-3 Views   Meds ordered this encounter  Medications  . methylPREDNISolone (MEDROL) 4 MG tablet    Sig: Medrol dose pack. Take as instructed    Dispense:  21 tablet    Refill:  0  . nabumetone (RELAFEN) 500 MG tablet    Sig: Take 1 tablet (500 mg total) by mouth 2 (two) times daily as needed.    Dispense:  60 tablet    Refill:  1      Procedures: No procedures performed   Clinical Data: No additional findings.   Subjective: No chief complaint on file. The patient is listed as a new patient however 5 years ago we did perform a right total hip arthroplasty through direct anterior approach and 4 years ago a left total hip arthroplasty direct anterior approach.  He said his hips are doing great but about to 3 months ago he was hunting a bit over to pick up a large locking followed by pain in his buttocks area.  He was going down the back of his leg but denies any numbness and tingling.  He says it is less sore than it was but is  still sore and he points to the issue him as a source of his pain.  He denies any groin pain.  He denies any instability symptoms at all.  He is a very thin and active individual.  HPI  Review of Systems He denies any headache, chest pain, shortness of breath, fever, chills, nausea, vomiting.  He has not a diabetic.  Objective: Vital Signs: There were no vitals taken for this visit.  Physical Exam Is alert and oriented x3 and in no acute distress Ortho Exam He has excellent flexion-extension lumbar spine with no pain.  His negative straight leg raise bilaterally.  He has 5 out of 5 strength in his bilateral lower extremities.  He has full active and passive range of motion with his hips with no pain.  He has some pain over the proximal hamstring Specialty Comments:  No specialty comments available.  Imaging: Xr Hip Unilat W Or W/o Pelvis 1v Right  Result Date: 05/21/2017 An AP pelvis    PMFS History: Patient Active Problem List   Diagnosis Date Noted  . Pain in right hip 05/21/2017  .  Status post THR (total hip replacement) 05/27/2013  . Degenerative arthritis of hip 06/25/2012   Past Medical History:  Diagnosis Date  . Arthritis     History reviewed. No pertinent family history.  Past Surgical History:  Procedure Laterality Date  . HERNIA REPAIR  2011   left inguinal hernia repair  . NOSE SURGERY     chain saw accident on nose  . TOTAL HIP ARTHROPLASTY  06/25/2012   Procedure: TOTAL HIP ARTHROPLASTY ANTERIOR APPROACH;  Surgeon: Earl Hitchhristopher Y Maleeha Halls, MD;  Location: [facility];  Service: Orthopedics;  Laterality: Right;  Right Total Hip Arthroplasty, Anterior Approach (C-Arm)  . TOTAL HIP ARTHROPLASTY Left 05/27/2013   Procedure: LEFT TOTAL HIP ARTHROPLASTY ANTERIOR APPROACH;  Surgeon: Earl Hitchhristopher Y Alverto Shedd, MD;  Location: [facility];  Service: Orthopedics;  Laterality: Left;   Social History   Occupational History  . Not on file  Tobacco Use  . Smoking status: Never  Smoker  . Smokeless tobacco: Never Used  Substance and Sexual Activity  . Alcohol use: No  . Drug use: No  . Sexual activity: Yes

## 2017-08-26 ENCOUNTER — Ambulatory Visit (INDEPENDENT_AMBULATORY_CARE_PROVIDER_SITE_OTHER): Payer: Medicare Other

## 2017-08-26 ENCOUNTER — Ambulatory Visit (INDEPENDENT_AMBULATORY_CARE_PROVIDER_SITE_OTHER): Payer: Medicare Other | Admitting: Physician Assistant

## 2017-08-26 ENCOUNTER — Encounter (INDEPENDENT_AMBULATORY_CARE_PROVIDER_SITE_OTHER): Payer: Self-pay | Admitting: Physician Assistant

## 2017-08-26 DIAGNOSIS — M25512 Pain in left shoulder: Secondary | ICD-10-CM | POA: Diagnosis not present

## 2017-08-26 MED ORDER — LIDOCAINE HCL 1 % IJ SOLN
3.0000 mL | INTRAMUSCULAR | Status: AC | PRN
  Administered 2017-08-26: 3 mL

## 2017-08-26 MED ORDER — METHYLPREDNISOLONE ACETATE 40 MG/ML IJ SUSP
40.0000 mg | INTRAMUSCULAR | Status: AC | PRN
  Administered 2017-08-26: 40 mg via INTRA_ARTICULAR
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
## 2017-09-16 ENCOUNTER — Ambulatory Visit (INDEPENDENT_AMBULATORY_CARE_PROVIDER_SITE_OTHER): Payer: Medicare Other | Admitting: Physician Assistant

## 2018-11-23 ENCOUNTER — Ambulatory Visit (INDEPENDENT_AMBULATORY_CARE_PROVIDER_SITE_OTHER): Payer: Medicare Other | Admitting: Family Medicine

## 2018-11-23 ENCOUNTER — Encounter: Payer: Self-pay | Admitting: Family Medicine

## 2018-11-23 ENCOUNTER — Other Ambulatory Visit: Payer: Self-pay

## 2018-11-23 DIAGNOSIS — M25562 Pain in left knee: Secondary | ICD-10-CM
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
## 2019-01-24 ENCOUNTER — Ambulatory Visit (INDEPENDENT_AMBULATORY_CARE_PROVIDER_SITE_OTHER): Payer: Medicare Other | Admitting: Family Medicine

## 2019-01-24 ENCOUNTER — Encounter: Payer: Self-pay | Admitting: Family Medicine

## 2019-01-24 ENCOUNTER — Ambulatory Visit: Payer: Self-pay

## 2019-01-24 DIAGNOSIS — M25562 Pain in left knee: Secondary | ICD-10-CM | POA: Diagnosis not present
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
## 2019-01-24 NOTE — Progress Notes (Signed)
I saw and examined the patient with Dr. Mayer Masker and agree with assessment and plan as outlined.  Posterolateral pain and joint effusioin.  Aspirated 50 cc today and injected with steroid.  Going hunting for elk in [place] next month.  If pain persists, will order x-rays and MRI.

## 2019-05-09 ENCOUNTER — Ambulatory Visit: Payer: Medicare Other | Admitting: Family Medicine

## 2021-03-28 ENCOUNTER — Other Ambulatory Visit: Payer: Self-pay

## 2021-03-28 ENCOUNTER — Ambulatory Visit: Payer: Self-pay

## 2021-03-28 ENCOUNTER — Ambulatory Visit (INDEPENDENT_AMBULATORY_CARE_PROVIDER_SITE_OTHER): Payer: Medicare Other | Admitting: Surgery

## 2021-03-28 ENCOUNTER — Encounter: Payer: Self-pay | Admitting: Surgery

## 2021-03-28 VITALS — BP 102/62 | HR 64 | Ht 71.0 in | Wt 172.2 lb

## 2021-03-28 DIAGNOSIS — M7541 Impingement syndrome of right shoulder: Secondary | ICD-10-CM

## 2021-03-28 DIAGNOSIS — M25511 Pain in right shoulder: Secondary | ICD-10-CM | POA: Diagnosis not present

## 2021-04-05 ENCOUNTER — Telehealth: Payer: Self-pay | Admitting: Orthopedic Surgery

## 2021-04-05 NOTE — Telephone Encounter (Signed)
Spoke with patient's wife Gavin Pound she advised will contact patient and call back to schedule his MRI review with Dr. August Saucer
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
## 2021-04-26 ENCOUNTER — Ambulatory Visit
Admission: RE | Admit: 2021-04-26 | Discharge: 2021-04-26 | Disposition: A | Discharge status: Home / Self Care | Payer: Medicare Other | Source: Ambulatory Visit | Attending: Surgery | Admitting: Surgery

## 2021-04-26 ENCOUNTER — Other Ambulatory Visit: Payer: Self-pay | Admitting: Surgery

## 2021-04-26 ENCOUNTER — Other Ambulatory Visit: Payer: Self-pay

## 2021-04-26 DIAGNOSIS — M7541 Impingement syndrome of right shoulder: Secondary | ICD-10-CM

## 2021-04-26 DIAGNOSIS — M25511 Pain in right shoulder: Secondary | ICD-10-CM

## 2021-04-26 MED ORDER — IOPAMIDOL (ISOVUE-M 200) INJECTION 41%
15.0000 mL | Freq: Once | INTRAMUSCULAR | Status: AC
  Administered 2021-04-26: 15 mL via INTRA_ARTICULAR

## 2021-05-30 ENCOUNTER — Ambulatory Visit: Payer: Medicare Other | Admitting: Surgery

## 2021-06-06 ENCOUNTER — Encounter: Payer: Self-pay | Admitting: Surgery

## 2021-06-06 ENCOUNTER — Other Ambulatory Visit: Payer: Self-pay

## 2021-06-06 ENCOUNTER — Ambulatory Visit (INDEPENDENT_AMBULATORY_CARE_PROVIDER_SITE_OTHER): Payer: Medicare Other | Admitting: Surgery

## 2021-06-06 VITALS — BP 116/74 | HR 57 | Ht 71.0 in | Wt 172.2 lb

## 2021-06-06 DIAGNOSIS — S46011A Strain of muscle(s) and tendon(s) of the rotator cuff of right shoulder, initial encounter: Secondary | ICD-10-CM | POA: Diagnosis not present

## 2021-06-06 DIAGNOSIS — S4351XA Sprain of right acromioclavicular joint, initial encounter: Secondary | ICD-10-CM
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
## 2021-07-03 ENCOUNTER — Ambulatory Visit: Payer: Medicare Other | Admitting: Orthopedic Surgery

## 2021-07-05 ENCOUNTER — Ambulatory Visit: Payer: Medicare Other | Admitting: Surgical

## 2021-07-05 ENCOUNTER — Telehealth: Payer: Self-pay | Admitting: Orthopedic Surgery

## 2021-07-05 NOTE — Telephone Encounter (Signed)
Pt wife called. States husband elbow is very swollen and he can't move it. She wants to know if he can come in to be seen today?   [phone]

## 2021-07-05 NOTE — Telephone Encounter (Signed)
Appt scheduled

## 2021-07-15 ENCOUNTER — Other Ambulatory Visit: Payer: Self-pay

## 2021-07-15 ENCOUNTER — Ambulatory Visit (INDEPENDENT_AMBULATORY_CARE_PROVIDER_SITE_OTHER): Payer: Medicare Other | Admitting: Orthopedic Surgery

## 2021-07-15 DIAGNOSIS — S46011A Strain of muscle(s) and tendon(s) of the rotator cuff of right shoulder, initial encounter: Secondary | ICD-10-CM | POA: Diagnosis not present

## 2021-07-16 ENCOUNTER — Encounter: Payer: Self-pay | Admitting: Orthopedic Surgery
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
## 2021-09-30 ENCOUNTER — Ambulatory Visit: Payer: Medicare Other | Admitting: Orthopedic Surgery

## 2023-02-11 ENCOUNTER — Encounter: Payer: Self-pay | Admitting: Orthopaedic Surgery

## 2023-02-11 ENCOUNTER — Other Ambulatory Visit (INDEPENDENT_AMBULATORY_CARE_PROVIDER_SITE_OTHER): Payer: Medicare Other

## 2023-02-11 ENCOUNTER — Ambulatory Visit (INDEPENDENT_AMBULATORY_CARE_PROVIDER_SITE_OTHER): Payer: Medicare Other | Admitting: Orthopaedic Surgery

## 2023-02-11 DIAGNOSIS — Z96642 Presence of left artificial hip joint: Secondary | ICD-10-CM

## 2023-02-11 DIAGNOSIS — M25551 Pain in right hip: Secondary | ICD-10-CM | POA: Diagnosis not present

## 2023-02-11 NOTE — Addendum Note (Signed)
Addended by: Henrine Screws L on: 02/11/2023 11:24 AM   Modules accepted: Orders

## 2023-02-11 NOTE — Progress Notes (Signed)
HPI: [name] returns today due to acute injury.  He reports he was walking on a metal roof that he was helping to repair last Friday when he lost his footing.  He states he was going headfirst and having to jump landing on his left side mostly head and left hip.  He was seen at the emergency room at [facility].  Images unavailable reports are available.  X-ray of the pelvis and left hip was unable to exclude hip fracture therefore CT scan was performed.  CT scan showed per the report status post left total hip arthroplasty without any complicating features or fractures.  He also had a head CT with contrast, CT chest abdomen pelvis, CT spine thoracic and lumbar and a CT of the left hip and pelvis performed.  CT scan of the head and cervical spine showed no acute abnormalities particularly of the sinus and orbits. He states that he has been able to ambulate on the hip with some pain mostly pain is lateral aspect of the hip.  He is using no assistive device to ambulate.  He also notes that he continues to spit up. He describes fresh and old blood whenever he has to clear his nose and throat and is wondering if this is normal.  Review of systems: Negative for fevers chills.  Negative for visual changes or headache.  Physical exam: General Well-developed well-nourished male no acute distress. Respiratory: Unlabored Facial: Ecchymosis left side of the face. Bilateral hips: Good range of motion of both hips minimal discomfort with external and internal rotation of the left hip but no blocks.  Tenderness over the left trochanteric region.  Nontender over the left thigh.  Full range of motion bilateral knees.  Ambulates with a slight antalgic gait on the left.   Radiographs: AP pelvis lateral view of the left hip showed no acute fractures or findings.  Status post left total hip arthroplasty well seated components.  Also right total hip arthroplasty components.  Well-seated.  Bilateral hips well  located.  Impression: Left hip contusion  Will see him back in 4 weeks to see how he is doing overall from the left hip.  No high-impact activities.  Weightbearing otherwise as tolerated.  In regards to the bleeding from his nose or mouth we will have him see ear nose and throat for evaluation.  Again CT scan was read as the paranasal sinuses and mastoid air to be clear.  Orbits and globes were intact.  Questions were encouraged and answered by Dr. Magnus Stanley and myself.

## 2023-03-16 ENCOUNTER — Ambulatory Visit: Payer: Medicare Other | Admitting: Orthopaedic Surgery

## 2023-06-22 ENCOUNTER — Ambulatory Visit: Payer: Medicare Other | Admitting: Physician Assistant

## 2023-06-22 ENCOUNTER — Encounter: Payer: Self-pay | Admitting: Physician Assistant

## 2023-06-22 ENCOUNTER — Other Ambulatory Visit (INDEPENDENT_AMBULATORY_CARE_PROVIDER_SITE_OTHER): Payer: Medicare Other

## 2023-06-22 DIAGNOSIS — M25521 Pain in right elbow: Secondary | ICD-10-CM

## 2023-06-22 NOTE — Progress Notes (Signed)
 Office Visit Note   Patient: Earl Stanley           Date of Birth: [birth year]           MRN: [id number] Visit Date: 06/22/2023              Requested by: Raenell Dodrill, NP [address] [place],  [place] [address] PCP: Raenell Dodrill, NP   Assessment & Plan: Visit Diagnoses:  1. Pain in right elbow     Plan: Patient is a right-hand-dominant pleasant 73 year old gentleman who has a 2-week history of volar right elbow pain.  He relates this to when he was picking up some PVC piping and flexing his arm.  Denies hearing a pop.  Still continues to have burning over the volar surface of his elbow.  He does have quite a bit of arthritis I do not see any acute fractures.  He has good biceps strength however it is painful considering partial biceps tearing.  I do not have any bruising but this was 2 weeks ago we will place him in a sling have him have return in 2 weeks.  Should avoid lifting with this arm.  He can come out of the sling to do a little motion also talked about trying some Voltaren gel  Follow-Up Instructions: No follow-ups on file.   Orders:  Orders Placed This Encounter  Procedures   XR Elbow Complete Right (3+View)   No orders of the defined types were placed in this encounter.     Procedures: No procedures performed   Clinical Data: No additional findings.   Subjective: Chief Complaint  Patient presents with   Right Elbow - Pain    HPI Patient is a pleasant 73 year old gentleman with a chief complaint of right elbow pain.  He feels popping and burning is tender it has been going on about 2 weeks.  He said this occurred when he pushed a piece of plywood.  Some movements increase the pain and burning.  No paresthesias he does have some swelling Review of Systems  All other systems reviewed and are negative.    Objective: Vital Signs: There were no vitals taken for this visit.  Physical Exam Constitutional:       Appearance: Normal appearance.  Pulmonary:     Effort: Pulmonary effort is normal.  Skin:    General: Skin is warm and dry.  Neurological:     General: No focal deficit present.     Mental Status: He is alert.  Psychiatric:        Mood and Affect: Mood normal.        Behavior: Behavior normal.     Ortho Exam Examination of his right elbow medical no ecchymosis no swelling he is able to form his biceps I can palpate the biceps tendon.  However he is tender.  Biceps similar to the left side.  He has good resisted extension and flexion of his arm.  No tenderness over the triceps no pain with supination or pronation.  Grip strength intact Specialty Comments:  No specialty comments available.  Imaging: No results found.   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 No pertinent family history.  Past Surgical History:  Procedure  Laterality Date   HERNIA REPAIR  2011   left inguinal hernia repair   NOSE SURGERY     chain saw accident on nose   TOTAL HIP ARTHROPLASTY  06/25/2012   Procedure: TOTAL HIP ARTHROPLASTY ANTERIOR APPROACH;  Surgeon: Lonni CINDERELLA Poli, MD;  Location: [facility];  Service: Orthopedics;  Laterality: Right;  Right Total Hip Arthroplasty, Anterior Approach (C-Arm)   TOTAL HIP ARTHROPLASTY Left 05/27/2013   Procedure: LEFT TOTAL HIP ARTHROPLASTY ANTERIOR APPROACH;  Surgeon: Lonni CINDERELLA Poli, MD;  Location: [facility];  Service: Orthopedics;  Laterality: Left;   Social History   Occupational History   Not on file  Tobacco Use   Smoking status: Never   Smokeless tobacco: Never  Substance and Sexual Activity   Alcohol use: No   Drug use: No   Sexual activity: Yes

## 2023-07-07 ENCOUNTER — Ambulatory Visit: Payer: Medicare Other | Admitting: Physician Assistant

## 2023-07-23 ENCOUNTER — Encounter: Payer: Self-pay | Admitting: Physician Assistant

## 2023-07-23 ENCOUNTER — Other Ambulatory Visit: Payer: Self-pay

## 2023-07-23 ENCOUNTER — Ambulatory Visit: Payer: Medicare Other | Admitting: Physician Assistant

## 2023-07-23 DIAGNOSIS — M545 Low back pain, unspecified: Secondary | ICD-10-CM

## 2023-07-23 DIAGNOSIS — M5440 Lumbago with sciatica, unspecified side: Secondary | ICD-10-CM

## 2023-07-23 DIAGNOSIS — G8929 Other chronic pain: Secondary | ICD-10-CM

## 2023-07-23 MED ORDER — METHYLPREDNISOLONE 4 MG PO TBPK: ORAL_TABLET | ORAL | 21 refills | Status: AC

## 2023-07-23 MED ORDER — METHYLPREDNISOLONE 4 MG PO TBPK: ORAL_TABLET | ORAL | 0 refills | Status: DC

## 2023-07-23 NOTE — Progress Notes (Signed)
   Office Visit Note   Patient: Earl Stanley           Date of Birth: [birth year]           MRN: [id number] Visit Date: 07/23/2023              Requested by: Raenell Dodrill, NP [address] [place],  [place] [address] PCP: Raenell Dodrill, NP   Assessment & Plan: Visit Diagnoses:  1. Acute bilateral low back pain with sciatica, sciatica laterality unspecified     Plan:   Follow-Up Instructions: No follow-ups on file.   Orders:  No orders of the defined types were placed in this encounter.  No orders of the defined types were placed in this encounter.     Procedures: No procedures performed   Clinical Data: No additional findings.   Subjective: No chief complaint on file.   HPI  Review of Systems  All other systems reviewed and are negative.    Objective: Vital Signs: There were no vitals taken for this visit.  Physical Exam Pulmonary:     Effort: Pulmonary effort is normal.  Skin:    General: Skin is warm and dry.  Neurological:     General: No focal deficit present.     Mental Status: He is alert and oriented to person, place, and time.  Psychiatric:        Mood and Affect: Mood normal.        Behavior: Behavior normal.     Ortho Exam  Specialty Comments:  No specialty comments available.  Imaging: No results found.   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 No pertinent family history.  Past Surgical History:  Procedure Laterality Date   HERNIA REPAIR  2011   left inguinal hernia repair   NOSE SURGERY     chain saw accident on nose   TOTAL HIP ARTHROPLASTY  06/25/2012   Procedure: TOTAL HIP ARTHROPLASTY ANTERIOR APPROACH;  Surgeon:  Lonni CINDERELLA Poli, MD;  Location: [facility];  Service: Orthopedics;  Laterality: Right;  Right Total Hip Arthroplasty, Anterior Approach (C-Arm)   TOTAL HIP ARTHROPLASTY Left 05/27/2013   Procedure: LEFT TOTAL HIP ARTHROPLASTY ANTERIOR APPROACH;  Surgeon: Lonni CINDERELLA Poli, MD;  Location: [facility];  Service: Orthopedics;  Laterality: Left;   Social History   Occupational History   Not on file  Tobacco Use   Smoking status: Never   Smokeless tobacco: Never  Substance and Sexual Activity   Alcohol use: No   Drug use: No   Sexual activity: Yes

## 2023-07-23 NOTE — Progress Notes (Signed)
 Office Visit Note   Patient: Earl Stanley           Date of Birth: 06/09/1951           MRN: [id number] Visit Date: 07/23/2023              Requested by: Raenell Dodrill, NP [address] [place],  [place] [address] PCP: Raenell Dodrill, NP   Assessment & Plan: Visit Diagnoses:  1. Acute bilateral low back pain with sciatica, sciatica laterality unspecified   2. Chronic left-sided low back pain, unspecified whether sciatica present     Plan: Patient is a pleasant 73 year old active gentleman with a 2 to 5-week history of back pain going down the back of his left leg from his left buttock.  Occasionally goes down to the calf describes it as burning.  He said years ago he had a bulging disc and did exercises and rest and it improved.  Denies any loss of bowel or bladder control he does have some facet arthropathy and some arthritis but overall well-maintained alignment no acute fractures findings consistent with a radiculopathy but no red flags his strength is good his sensation is intact.  Will elect to treat this conservatively he was given and instructed in home exercise program by her athletic trainer.  Also have called him in some steroid taper he knows not to take other anti-inflammatories with this and to take it with food.  Will call me if he does not improve  Follow-Up Instructions: No follow-ups on file.   Orders:  Orders Placed This Encounter  Procedures   XR Lumbar Spine 2-3 Views   No orders of the defined types were placed in this encounter.     Procedures: No procedures performed   Clinical Data: No additional findings.   Subjective: No chief complaint on file.   HPI Patient is a pleasant 73 year old gentleman with a chief complaint of low back pain going down the left leg.  Its mostly in the left buttock but occasionally goes down to the calf.  It is most constant when he is on his feet for a long time mostly burning and  tingling wonders has a history of a bulging disc.  States his pain is most noticeable when he first stands up after he has been sitting a while denies loss of bowel or bladder control  Review of Systems  All other systems reviewed and are negative.    Objective: Vital Signs: There were no vitals taken for this visit.  Physical Exam Constitutional:      Appearance: Normal appearance.  Pulmonary:     Effort: Pulmonary effort is normal.  Skin:    General: Skin is warm and dry.  Neurological:     General: No focal deficit present.     Mental Status: He is alert and oriented to person, place, and time.  Psychiatric:        Mood and Affect: Mood normal.        Behavior: Behavior normal.     Ortho Exam Low back no redness no swelling he has no step-offs.  He does have some stiffness with bending forward.  Negative straight leg raise strength is 5 out of 5 with dorsiflexion plantarflexion of his ankles extension flexion of his legs no pain in his hip with logrolling. Specialty Comments:  No specialty comments available.  Imaging: No results found.   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 No pertinent family history.  Past Surgical History:  Procedure Laterality Date   HERNIA REPAIR  2011   left inguinal hernia repair   NOSE SURGERY     chain saw accident on nose   TOTAL HIP ARTHROPLASTY  06/25/2012   Procedure: TOTAL HIP ARTHROPLASTY ANTERIOR APPROACH;  Surgeon: Lonni CINDERELLA Poli, MD;  Location: [facility];  Service: Orthopedics;  Laterality: Right;  Right Total Hip Arthroplasty, Anterior Approach (C-Arm)   TOTAL HIP ARTHROPLASTY Left 05/27/2013   Procedure: LEFT TOTAL HIP ARTHROPLASTY ANTERIOR APPROACH;  Surgeon:  Lonni CINDERELLA Poli, MD;  Location: [facility];  Service: Orthopedics;  Laterality: Left;   Social History   Occupational History   Not on file  Tobacco Use   Smoking status: Never   Smokeless tobacco: Never  Substance and Sexual Activity   Alcohol use: No   Drug use: No   Sexual activity: Yes

## 2024-04-18 ENCOUNTER — Encounter: Payer: Self-pay | Admitting: Radiology
